# Patient Record
Sex: Female | Born: 1970 | Marital: Married | Smoking: Never smoker
Health system: Southern US, Community
[De-identification: ages and names within clinical notes are randomized; demographics above are authoritative.]

## PROBLEM LIST (undated history)

## (undated) DIAGNOSIS — F329 Major depressive disorder, single episode, unspecified: Secondary | ICD-10-CM

## (undated) DIAGNOSIS — R51 Headache: Secondary | ICD-10-CM

## (undated) DIAGNOSIS — F32A Depression, unspecified: Secondary | ICD-10-CM

## (undated) HISTORY — DX: Depression, unspecified: F32.A

## (undated) HISTORY — DX: Major depressive disorder, single episode, unspecified: F32.9

## (undated) HISTORY — DX: Headache: R51

---

## 2003-03-21 LAB — HM COLONOSCOPY: HM COLON: NORMAL

## 2003-08-14 HISTORY — PX: CHOLECYSTECTOMY: SHX55

## 2004-06-22 ENCOUNTER — Ambulatory Visit: Payer: Self-pay | Admitting: Internal Medicine

## 2004-09-13 ENCOUNTER — Ambulatory Visit: Payer: Self-pay | Admitting: Unknown Physician Specialty

## 2004-10-06 ENCOUNTER — Ambulatory Visit: Payer: Self-pay | Admitting: Surgery

## 2004-11-08 ENCOUNTER — Ambulatory Visit: Payer: Self-pay | Admitting: Unknown Physician Specialty

## 2005-05-17 ENCOUNTER — Ambulatory Visit: Payer: Self-pay | Admitting: Unknown Physician Specialty

## 2005-06-07 ENCOUNTER — Ambulatory Visit: Payer: Self-pay | Admitting: Unknown Physician Specialty

## 2005-08-30 ENCOUNTER — Ambulatory Visit: Payer: Self-pay | Admitting: Internal Medicine

## 2006-10-10 ENCOUNTER — Ambulatory Visit: Payer: Self-pay | Admitting: Unknown Physician Specialty

## 2008-04-22 ENCOUNTER — Ambulatory Visit: Payer: Self-pay | Admitting: Obstetrics & Gynecology

## 2008-08-13 HISTORY — PX: TUBAL LIGATION: SHX77

## 2009-01-27 ENCOUNTER — Ambulatory Visit: Payer: Self-pay | Admitting: Unknown Physician Specialty

## 2009-03-29 ENCOUNTER — Ambulatory Visit: Payer: Self-pay | Admitting: Internal Medicine

## 2009-09-20 ENCOUNTER — Ambulatory Visit: Payer: Self-pay | Admitting: Urology

## 2010-08-15 ENCOUNTER — Ambulatory Visit: Payer: Self-pay | Admitting: Internal Medicine

## 2010-12-21 ENCOUNTER — Ambulatory Visit: Payer: Self-pay | Admitting: Unknown Physician Specialty

## 2011-01-16 ENCOUNTER — Ambulatory Visit: Payer: Self-pay | Admitting: Internal Medicine

## 2011-07-11 ENCOUNTER — Ambulatory Visit (INDEPENDENT_AMBULATORY_CARE_PROVIDER_SITE_OTHER): Payer: PRIVATE HEALTH INSURANCE | Admitting: Internal Medicine

## 2011-07-11 ENCOUNTER — Encounter: Payer: Self-pay | Admitting: Internal Medicine

## 2011-07-11 VITALS — BP 102/66 | HR 75 | Temp 98.2°F | Resp 16 | Ht 65.0 in | Wt 219.8 lb

## 2011-07-11 DIAGNOSIS — F329 Major depressive disorder, single episode, unspecified: Secondary | ICD-10-CM | POA: Insufficient documentation

## 2011-07-11 DIAGNOSIS — L299 Pruritus, unspecified: Secondary | ICD-10-CM

## 2011-07-11 MED ORDER — HYDROXYZINE HCL 25 MG PO TABS: 25.0000 mg | ORAL_TABLET | Freq: Three times a day (TID) | ORAL | Status: AC | PRN

## 2011-07-11 MED ORDER — DULOXETINE HCL 60 MG PO CPEP: 60.0000 mg | ORAL_CAPSULE | Freq: Every day | ORAL | Status: DC

## 2011-07-11 MED ORDER — ALPRAZOLAM 0.5 MG PO TABS: 0.5000 mg | ORAL_TABLET | Freq: Every day | ORAL | Status: AC | PRN

## 2011-07-12 NOTE — Progress Notes (Signed)
  Subjective:    Patient ID: Makayla Brown, female    DOB: 23-Dec-1970, 40 y.o.   MRN: [id number]  HPI    Review of Systems     Objective:   Physical Exam        Assessment & Plan:   Subjective:     Makayla Brown is a 40 y.o. female here for a routine exam.  Current complaints: none.  Personal health questionnaire reviewed: not asked.   Gynecologic History Patient's last menstrual period was 07/10/2011. Contraception: tubal ligation Last Pap: 2011. Results were: normal Last mammogram: 2012. Results were: normal  Obstetric History OB History    Grav Para Term Preterm Abortions TAB SAB Ect Mult Living                   The following portions of the patient's history were reviewed and updated as appropriate: allergies, current medications, past family history, past medical history, past social history, past surgical history and problem list.  Review of Systems A comprehensive review of systems was negative except for: Allergic/Immunologic: positive for urticaria    Objective:    BP 102/66  Pulse 75  Temp(Src) 98.2 F (36.8 C) (Oral)  Resp 16  Ht 5\' 5"  (1.651 m)  Wt 219 lb 12 oz (99.678 kg)  BMI 36.57 kg/m2  SpO2 100%  LMP 07/10/2011 General appearance: alert, cooperative and appears stated age Eyes: conjunctivae/corneas clear. PERRL, EOM's intact. Fundi benign. Ears: normal TM's and external ear canals both ears Throat: lips, mucosa, and tongue normal; teeth and gums normal Neck: no adenopathy, no carotid bruit, no JVD, supple, symmetrical, trachea midline, thyroid not enlarged, symmetric, no tenderness/mass/nodules and papular rash noted on right lateral neck and upper chest Back: symmetric, no curvature. ROM normal. No CVA tenderness. Lungs: clear to auscultation bilaterally Heart: regular rate and rhythm, S1, S2 normal, no murmur, click, rub or gallop Abdomen: soft, non-tender; bowel sounds normal; no masses,  no organomegaly Extremities: extremities  normal, atraumatic, no cyanosis or edema Pulses: 2+ and symmetric Lymph nodes: Cervical, supraclavicular, and axillary nodes normal. Neurologic: Alert and oriented X 3, normal strength and tone. Normal symmetric reflexes. Normal coordination and gait    Assessment:    Healthy female nongynelogic exam.  Recent fasting labs were reviewed with patient .   Microscopic hematuria:  Chronic, low level with prior urology evaluation. Pruritic rash:  Reviewed possible etiologies, no clear precipitants.  No improvement with steroid cream.  Looks like contact dermatitis , Benadryl cream and  atarax,  Stop steroid cream. If still present in 4 weeks refer to Dermatology for biopsy.     Plan:    Follow up in: 1 year.

## 2011-08-01 ENCOUNTER — Encounter: Payer: Self-pay | Admitting: Internal Medicine

## 2011-12-25 ENCOUNTER — Ambulatory Visit: Payer: Self-pay | Admitting: Internal Medicine

## 2012-01-22 ENCOUNTER — Encounter: Payer: Self-pay | Admitting: Internal Medicine

## 2012-03-20 ENCOUNTER — Ambulatory Visit (INDEPENDENT_AMBULATORY_CARE_PROVIDER_SITE_OTHER): Payer: PRIVATE HEALTH INSURANCE | Admitting: Internal Medicine

## 2012-03-20 ENCOUNTER — Encounter: Payer: Self-pay | Admitting: Internal Medicine

## 2012-03-20 VITALS — BP 100/72 | HR 76 | Temp 98.5°F | Resp 14 | Wt 220.0 lb

## 2012-03-20 DIAGNOSIS — K2960 Other gastritis without bleeding: Secondary | ICD-10-CM

## 2012-03-20 DIAGNOSIS — F411 Generalized anxiety disorder: Secondary | ICD-10-CM

## 2012-03-20 DIAGNOSIS — G44229 Chronic tension-type headache, not intractable: Secondary | ICD-10-CM

## 2012-03-20 DIAGNOSIS — R1013 Epigastric pain: Secondary | ICD-10-CM

## 2012-03-20 DIAGNOSIS — F419 Anxiety disorder, unspecified: Secondary | ICD-10-CM

## 2012-03-20 DIAGNOSIS — F329 Major depressive disorder, single episode, unspecified: Secondary | ICD-10-CM

## 2012-03-20 MED ORDER — OMEPRAZOLE 40 MG PO CPDR: 40.0000 mg | DELAYED_RELEASE_CAPSULE | Freq: Every day | ORAL | Status: DC

## 2012-03-20 MED ORDER — SERTRALINE HCL 50 MG PO TABS: 50.0000 mg | ORAL_TABLET | Freq: Every day | ORAL | Status: DC

## 2012-03-20 NOTE — Progress Notes (Signed)
Patient ID: Makayla Brown, female   DOB: Jan 19, 1971, 41 y.o.   MRN: [id number]  Subjective:    Makayla Brown is a 41 y.o. female who presents for evaluation of headache. Symptoms began about 3 weeks ago. Generally, the headaches last about 2 hours and occur every day. The headaches do not seem to be related to any time of the day. The headaches are usually pounding and are located in different locations.  The patient rates her most severe headaches a 7 on a scale from 1 to 10. Recently, the headaches have been increasing in frequency. Work attendance or other daily activities are affected by the headaches. Precipitating factors include: stress. The headaches are usually not preceded by an aura. Associated neurologic symptoms: depression. The patient denies dizziness, numbness of extremities, speech difficulties, vision problems and vomiting in the early morning. Home treatment has included ibuprofen with some improvement. Other history includes: nothing pertinent. Family history includes no known family members with significant headaches.  The following portions of the patient's history were reviewed and updated as appropriate: allergies, current medications, past family history, past medical history, past social history, past surgical history and problem list.  Review of Systems A comprehensive review of systems was negative except for: Respiratory: positive for snoring Gastrointestinal: positive for dyspepsia and reflux symptoms    Objective:    BP 100/72  Pulse 76  Temp 98.5 F (36.9 C) (Oral)  Resp 14  Wt 220 lb (99.791 kg)  SpO2 95%  LMP 02/18/2012  General Appearance:    Alert, cooperative, no distress, appears stated age  Head:    Normocephalic, without obvious abnormality, atraumatic  Eyes:    PERRL, conjunctiva/corneas clear, EOM's intact, fundi    benign, both eyes  Ears:    Normal TM's and external ear canals, both ears  Nose:   Nares normal, septum midline, mucosa normal, no  drainage    or sinus tenderness  Throat:   Lips, mucosa, and tongue normal; teeth and gums normal  Neck:   Supple, symmetrical, trachea midline, no adenopathy;    thyroid:  no enlargement/tenderness/nodules; no carotid   bruit or JVD  Back:     Symmetric, no curvature, ROM normal, no CVA tenderness  Lungs:     Clear to auscultation bilaterally, respirations unlabored  Chest Wall:    No tenderness or deformity   Heart:    Regular rate and rhythm, S1 and S2 normal, no murmur, rub   or gallop  Extremities:   Extremities normal, atraumatic, no cyanosis or edema  Pulses:   2+ and symmetric all extremities  Skin:   Skin color, texture, turgor normal, no rashes or lesions  Lymph nodes:   Cervical, supraclavicular, and axillary nodes normal  Neurologic:   CNII-XII intact, normal strength, sensation and reflexes    throughout      Assessment:    Depression With generalized anxiety features.  Discussed adding low dose sertraline to cymbalta, starting with 25 mg daily for the first week and increase to 50 mg if tolerated. Addition of low-dose alprazolam one to 2 times daily for insomnia or anxiety when necessary. Reassessment either in person or via my chart in 30 days.  Reflux gastritis Trial of over-the-counter omeprazole 20 mg daily take 20 minutes prior to breakfast. If symptoms do not improve she will call back. Symptoms do improve I suggested taking it for 4-6 weeks and then stopping.  Headache, tension type, chronic Suggested by exam and history. Patient was given advice to  lie in darkened room and apply cold packs as needed for pain. Side effect profile discussed in detail. Asked to keep headache diary. Patient reassured that neurodiagnostic workup not indicated from benign H&P.     Updated Medication List Outpatient Encounter Prescriptions as of 03/20/2012  Medication Sig Dispense Refill  . ALPRAZolam (XANAX) 0.5 MG tablet Take 1 tablet (0.5 mg total) by mouth daily as needed for  sleep or anxiety.  30 tablet  0  . DULoxetine (CYMBALTA) 60 MG capsule Take 1 capsule (60 mg total) by mouth daily.  30 capsule  6  . omeprazole (PRILOSEC) 40 MG capsule Take 1 capsule (40 mg total) by mouth daily.  30 capsule  3  . sertraline (ZOLOFT) 50 MG tablet Take 1 tablet (50 mg total) by mouth daily.  30 tablet  3

## 2012-03-20 NOTE — Patient Instructions (Addendum)
I am adding omeprazole for your recurrent nausea and gastritis symptoms.  Take it daily about 20 minutes before a meal.  You can stop it in a month and see if the symptoms return.  Addin zoloft for the daily anxiety.  Start with 1/2 tablet daily for one week, then increase to whole tablet.  Carve out 20 to 30 minutes to time for yourself to relax an do something you enjoy  Ask your husband about your snoring and your breathing pattern.  If it suggests sleep apnea,  Let me know.  Let me know how you are doing on MyChart

## 2012-03-22 ENCOUNTER — Encounter: Payer: Self-pay | Admitting: Internal Medicine

## 2012-03-22 DIAGNOSIS — G44229 Chronic tension-type headache, not intractable: Secondary | ICD-10-CM | POA: Insufficient documentation

## 2012-03-22 DIAGNOSIS — K2960 Other gastritis without bleeding: Secondary | ICD-10-CM | POA: Insufficient documentation

## 2012-03-22 NOTE — Assessment & Plan Note (Signed)
Trial of over-the-counter omeprazole 20 mg daily take 20 minutes prior to breakfast. If symptoms do not improve she will call back. Symptoms do improve I suggested taking it for 4-6 weeks and then stopping.

## 2012-03-22 NOTE — Assessment & Plan Note (Signed)
Suggested by

## 2012-03-22 NOTE — Assessment & Plan Note (Addendum)
With generalized anxiety features.  Discussed adding low dose sertraline to cymbalta, starting with 25 mg daily for the first week and increase to 50 mg if tolerated. Addition of low-dose alprazolam one to 2 times daily for insomnia or anxiety when necessary. Reassessment either in person or via my chart in 30 days.

## 2012-06-14 ENCOUNTER — Ambulatory Visit: Payer: Self-pay | Admitting: Emergency Medicine

## 2012-08-28 ENCOUNTER — Other Ambulatory Visit: Payer: Self-pay | Admitting: Internal Medicine

## 2012-08-28 NOTE — Telephone Encounter (Signed)
Med filled.  

## 2013-02-18 ENCOUNTER — Encounter: Payer: Self-pay | Admitting: Internal Medicine

## 2013-03-06 ENCOUNTER — Encounter: Payer: PRIVATE HEALTH INSURANCE | Admitting: Internal Medicine

## 2013-03-18 ENCOUNTER — Encounter: Payer: PRIVATE HEALTH INSURANCE | Admitting: Internal Medicine

## 2013-03-20 ENCOUNTER — Other Ambulatory Visit (HOSPITAL_COMMUNITY)
Admission: RE | Admit: 2013-03-20 | Discharge: 2013-03-20 | Disposition: A | Discharge status: Home / Self Care | Payer: BC Managed Care – PPO | Source: Ambulatory Visit | Attending: Internal Medicine | Admitting: Internal Medicine

## 2013-03-20 ENCOUNTER — Ambulatory Visit (INDEPENDENT_AMBULATORY_CARE_PROVIDER_SITE_OTHER): Payer: BC Managed Care – PPO | Admitting: Internal Medicine

## 2013-03-20 ENCOUNTER — Encounter: Payer: Self-pay | Admitting: Internal Medicine

## 2013-03-20 VITALS — BP 114/72 | HR 58 | Temp 98.4°F | Resp 14 | Ht 65.0 in | Wt 207.2 lb

## 2013-03-20 DIAGNOSIS — Z1151 Encounter for screening for human papillomavirus (HPV): Secondary | ICD-10-CM | POA: Insufficient documentation

## 2013-03-20 DIAGNOSIS — Z01419 Encounter for gynecological examination (general) (routine) without abnormal findings: Secondary | ICD-10-CM | POA: Insufficient documentation

## 2013-03-20 DIAGNOSIS — Z1239 Encounter for other screening for malignant neoplasm of breast: Secondary | ICD-10-CM

## 2013-03-20 DIAGNOSIS — Z0000 Encounter for general adult medical examination without abnormal findings: Secondary | ICD-10-CM

## 2013-03-20 DIAGNOSIS — F329 Major depressive disorder, single episode, unspecified: Secondary | ICD-10-CM

## 2013-03-20 DIAGNOSIS — E669 Obesity, unspecified: Secondary | ICD-10-CM

## 2013-03-20 NOTE — Patient Instructions (Addendum)
You have lost 13 lbs since last visit.  Good job!  Your BMI is now  34.4 (down from 36.5) .  Your goal is to get your BMI < 30 which should take another 20 lbs   You are not due to another PAP for 3 years unless this one is abnormal

## 2013-03-22 ENCOUNTER — Encounter: Payer: Self-pay | Admitting: Internal Medicine

## 2013-03-22 DIAGNOSIS — Z0000 Encounter for general adult medical examination without abnormal findings: Secondary | ICD-10-CM | POA: Insufficient documentation

## 2013-03-22 DIAGNOSIS — E669 Obesity, unspecified: Secondary | ICD-10-CM | POA: Insufficient documentation

## 2013-03-22 NOTE — Assessment & Plan Note (Signed)
Improving  BMI with wt loss of 13 lbs since last visit.   recommended a low glycemic index diet utilizing smaller more frequent meals to increase metabolism.  I have also recommended that patient add exercising with a goal of 30 minutes of aerobic exercise a minimum of 5 days per week. Screening for lipid disorders, thyroid and diabetes to be done today.

## 2013-03-22 NOTE — Progress Notes (Signed)
Patient ID: Makayla Brown, female   DOB: [birth year], 42 y.o.   MRN: [id number]  Subjective:     Makayla Brown is a 42 y.o. female and is here for a comprehensive physical exam. The patient reports no problems.  History   Social History  . Marital Status: Married    Spouse Name: N/A    Number of Children: N/A  . Years of Education: N/A   Occupational History  . Not on file.   Social History Main Topics  . Smoking status: Never Smoker   . Smokeless tobacco: Never Used  . Alcohol Use: No  . Drug Use: No  . Sexually Active: Not on file   Other Topics Concern  . Not on file   Social History Narrative  . No narrative on file   Health Maintenance  Topic Date Due  . Pap Smear  10/18/2012  . Influenza Vaccine  04/13/2013  . Tetanus/tdap  05/20/2022    The following portions of the patient's history were reviewed and updated as appropriate: allergies, current medications, past family history, past medical history, past social history, past surgical history and problem list.  Review of Systems A comprehensive review of systems was negative.   Objective:     BP 114/72  Pulse 58  Temp(Src) 98.4 F (36.9 C) (Oral)  Resp 14  Ht 5\' 5"  (1.651 m)  Wt 207 lb 4 oz (94.008 kg)  BMI 34.49 kg/m2  SpO2 94%  LMP 02/24/2013  General Appearance:    Alert, cooperative, no distress, appears stated age  Head:    Normocephalic, without obvious abnormality, atraumatic  Eyes:    PERRL, conjunctiva/corneas clear, EOM's intact, fundi    benign, both eyes  Ears:    Normal TM's and external ear canals, both ears  Nose:   Nares normal, septum midline, mucosa normal, no drainage    or sinus tenderness  Throat:   Lips, mucosa, and tongue normal; teeth and gums normal  Neck:   Supple, symmetrical, trachea midline, no adenopathy;    thyroid:  no enlargement/tenderness/nodules; no carotid   bruit or JVD  Back:     Symmetric, no curvature, ROM normal, no CVA tenderness  Lungs:     Clear to  auscultation bilaterally, respirations unlabored  Chest Wall:    No tenderness or deformity   Heart:    Regular rate and rhythm, S1 and S2 normal, no murmur, rub   or gallop  Breast Exam:    No tenderness, masses, or nipple abnormality  Abdomen:     Soft, non-tender, bowel sounds active all four quadrants,    no masses, no organomegaly  Genitalia:    Pelvic: cervix normal in appearance, external genitalia normal, no adnexal masses or tenderness, no cervical motion tenderness, rectovaginal septum normal, uterus normal size, shape, and consistency and vagina normal without discharge  Extremities:   Extremities normal, atraumatic, no cyanosis or edema  Pulses:   2+ and symmetric all extremities  Skin:   Skin color, texture, turgor normal, no rashes or lesions  Lymph nodes:   Cervical, supraclavicular, and axillary nodes normal  Neurologic:   CNII-XII intact, normal strength, sensation and reflexes    throughout   Assessment and Plan:  Routine general medical examination at a health care facility Annual comprehensive exam was done including breast, pelvic and PAP smear. All screenings have been addressed .   Obesity, unspecified Improving  BMI with wt loss of 13 lbs since last visit.   recommended a low  glycemic index diet utilizing smaller more frequent meals to increase metabolism.  I have also recommended that patient add exercising with a goal of 30 minutes of aerobic exercise a minimum of 5 days per week. Screening for lipid disorders, thyroid and diabetes to be done today.    Depression She did not tolerate  addition of sertraline. Continue cymbalta  With prn alprazolam for severe anxiety.    Updated Medication List Outpatient Encounter Prescriptions as of 03/20/2013  Medication Sig Dispense Refill  . CYMBALTA 60 MG capsule TAKE ONE CAPSULE BY MOUTH EVERY DAY  30 capsule  5  . omeprazole (PRILOSEC) 40 MG capsule Take 1 capsule (40 mg total) by mouth daily.  30 capsule  3  .  sertraline (ZOLOFT) 50 MG tablet Take 1 tablet (50 mg total) by mouth daily.  30 tablet  3   No facility-administered encounter medications on file as of 03/20/2013.

## 2013-03-22 NOTE — Assessment & Plan Note (Addendum)
She did not tolerate  addition of sertraline. Continue cymbalta  With prn alprazolam for severe anxiety.

## 2013-03-22 NOTE — Assessment & Plan Note (Signed)
Annual comprehensive exam was done including breast, pelvic and PAP smear. All screenings have been addressed .  

## 2013-03-24 ENCOUNTER — Encounter: Payer: Self-pay | Admitting: *Deleted

## 2013-03-24 MED ORDER — FLUCONAZOLE 150 MG PO TABS: 150.0000 mg | ORAL_TABLET | Freq: Every day | ORAL | Status: DC

## 2013-03-24 NOTE — Addendum Note (Signed)
Addended by: Sherlene Shams on: 03/24/2013 06:58 AM   Modules accepted: Orders

## 2013-04-07 ENCOUNTER — Encounter: Payer: Self-pay | Admitting: Internal Medicine

## 2013-04-21 ENCOUNTER — Encounter: Payer: Self-pay | Admitting: Internal Medicine

## 2013-06-18 ENCOUNTER — Other Ambulatory Visit: Payer: Self-pay

## 2013-06-22 ENCOUNTER — Encounter: Payer: Self-pay | Admitting: Adult Health

## 2013-06-22 ENCOUNTER — Ambulatory Visit (INDEPENDENT_AMBULATORY_CARE_PROVIDER_SITE_OTHER): Payer: BC Managed Care – PPO | Admitting: Adult Health

## 2013-06-22 VITALS — BP 128/74 | HR 72 | Temp 98.1°F | Resp 16 | Wt 206.0 lb

## 2013-06-22 DIAGNOSIS — J329 Chronic sinusitis, unspecified: Secondary | ICD-10-CM

## 2013-06-22 MED ORDER — FLUCONAZOLE 150 MG PO TABS: 150.0000 mg | ORAL_TABLET | Freq: Every day | ORAL | Status: DC

## 2013-06-22 MED ORDER — AMOXICILLIN-POT CLAVULANATE 875-125 MG PO TABS: 1.0000 | ORAL_TABLET | Freq: Two times a day (BID) | ORAL | Status: DC

## 2013-06-22 NOTE — Progress Notes (Signed)
Pre visit review using our clinic review tool, if applicable. No additional management support is needed unless otherwise documented below in the visit note. 

## 2013-06-22 NOTE — Progress Notes (Signed)
  Subjective:    Patient ID: Makayla Brown, female    DOB: September 06, 1970, 42 y.o.   MRN: [id number]  HPI  Pt is a 42 year old female who presents to clinic with sinus congestion x4-5 days, green drainage. She denies fever or chills. She has been taking Sudafed. Some post nasal drip causing throat irritation. Mild cough.  Current Outpatient Prescriptions on File Prior to Visit  Medication Sig Dispense Refill  . CYMBALTA 60 MG capsule TAKE ONE CAPSULE BY MOUTH EVERY DAY  30 capsule  5  . omeprazole (PRILOSEC) 40 MG capsule Take 1 capsule (40 mg total) by mouth daily.  30 capsule  3  . sertraline (ZOLOFT) 50 MG tablet Take 1 tablet (50 mg total) by mouth daily.  30 tablet  3   No current facility-administered medications on file prior to visit.      Review of Systems  Constitutional: Negative for fever and chills.  HENT: Positive for congestion, postnasal drip, rhinorrhea, sinus pressure and sore throat.   Respiratory: Positive for cough.        Objective:   Physical Exam  Constitutional: She is oriented to person, place, and time. She appears well-developed and well-nourished. No distress.  HENT:  Head: Normocephalic and atraumatic.  Right Ear: External ear normal.  Left Ear: External ear normal.  Mild pharyngeal erythema. No exudate.  Neck: Normal range of motion. Neck supple. No tracheal deviation present.  Cardiovascular: Normal rate, regular rhythm and normal heart sounds.  Exam reveals no gallop and no friction rub.   No murmur heard. Pulmonary/Chest: Effort normal and breath sounds normal. No respiratory distress. She has no wheezes. She has no rales.  Lymphadenopathy:    She has no cervical adenopathy.  Neurological: She is alert and oriented to person, place, and time.  Skin: Skin is warm and dry.  Psychiatric: She has a normal mood and affect. Her behavior is normal. Judgment and thought content normal.          Assessment & Plan:

## 2013-06-22 NOTE — Patient Instructions (Signed)
Start Augmentin twice daily x 10 days.  Irrigate sinuses with saline.    Sinusitis is a condition that can cause a stuffy nose, pain in the face, and yellow or green discharge (mucus) from the nose. The sinuses are hollow areas in the bones of the face. They have a thin lining that normally makes a small amount of mucus. When this lining gets infected, it swells and makes extra mucus. This causes symptoms.   Sinusitis can occur when a person gets sick with a cold. The germs causing the cold can also infect the sinuses. Many times, a person feels like his or her cold is getting better. But then he or she gets sinusitis and begins to feel sick again.  What are the symptoms of sinusitis? - Common symptoms of sinusitis include:  Stuffy or blocked nose  Thick yellow or green discharge from the nose  Pain in the teeth  Pain or pressure in the face - This often feels worse when a person bends forward.   People with sinusitis can also have other symptoms that include:  Fever  Cough  Trouble smelling  Ear pressure or fullness  Headache  Bad breath  Feeling tired   Most of the time, symptoms start to improve in 7 to 10 days.  See your doctor or nurse if your symptoms last more than 7 days, or if your symptoms get better at first but then get worse.  Sometimes, sinusitis can lead to serious problems. See your doctor or nurse right away (do not wait 7 days) if you have:  Fever higher than 102.68F (39.2C)  Sudden and severe pain in the face and head  Trouble seeing or seeing double  Trouble thinking clearly  Swelling or redness around 1 or both eyes  Trouble breathing or a stiff neck   Is there anything I can do on my own to feel better? - Yes. To reduce your symptoms, you can: Take an over-the-counter pain reliever to reduce the pain  Rinse your nose and sinuses with salt water a few times a day - Ask your doctor or nurse about the best way to do this.  Use a decongestant nose spray  - These sprays are sold in a pharmacy. But do not use decongestant nose sprays for more than 2 to 3 days in a row. Using them more than 3 days in a row can make symptoms worse.   You should NOT take an antihistamine for sinusitis. Common antihistamines include diphenhydramine (sample brand name: Benadryl), chlorpheniramine (sample brand name: Chlor-Trimeton), loratadine (sample brand name: Claritin), and cetirizine (sample brand name: Zyrtec). They can treat allergies, but not sinus infections, and could increase your discomfort by drying the lining of your nose and sinuses, or making you tired.   Your doctor might also prescribe a steroid nose spray to reduce the swelling in your nose. (Steroid nose sprays do not contain the same steroids that athletes take to build muscle.)  How is sinusitis treated? - Most of the time, sinusitis does not need to be treated with antibiotic medicines. This is because most sinusitis is caused by viruses - not bacteria - and antibiotics do not kill viruses. Many people get over sinus infections without antibiotics.  Some people with sinusitis do need treatment with antibiotics. If your symptoms have not improved after 7 to 10 days, ask your doctor if you should take antibiotics. Your doctor might recommend that you wait 1 more week to see if your symptoms improve.  But if you have symptoms such as a fever or a lot of pain, he or she might prescribe antibiotics. It is important to follow your doctor's instructions about taking your antibiotics.

## 2013-06-22 NOTE — Assessment & Plan Note (Signed)
Sinusitis - initially with clear drainage now green. Start augmentin x 10 days. Saline spray. Supportive tx for symptoms. RTC if symptoms not improved within 3-4 days.

## 2013-10-13 ENCOUNTER — Other Ambulatory Visit: Payer: Self-pay | Admitting: *Deleted

## 2013-10-13 MED ORDER — DULOXETINE HCL 60 MG PO CPEP: ORAL_CAPSULE | ORAL | Status: DC

## 2013-11-05 ENCOUNTER — Ambulatory Visit: Payer: BC Managed Care – PPO | Admitting: Internal Medicine

## 2013-11-05 ENCOUNTER — Ambulatory Visit (INDEPENDENT_AMBULATORY_CARE_PROVIDER_SITE_OTHER): Payer: BC Managed Care – PPO | Admitting: Internal Medicine

## 2013-11-05 ENCOUNTER — Encounter: Payer: Self-pay | Admitting: Internal Medicine

## 2013-11-05 VITALS — BP 106/70 | HR 68 | Temp 97.9°F | Resp 16 | Wt 207.2 lb

## 2013-11-05 DIAGNOSIS — Z0000 Encounter for general adult medical examination without abnormal findings: Secondary | ICD-10-CM

## 2013-11-05 DIAGNOSIS — J069 Acute upper respiratory infection, unspecified: Secondary | ICD-10-CM

## 2013-11-05 LAB — COMPREHENSIVE METABOLIC PANEL
ALK PHOS: 71 U/L (ref 39–117)
ALT: 14 U/L (ref 0–35)
AST: 14 U/L (ref 0–37)
Albumin: 3.8 g/dL (ref 3.5–5.2)
BILIRUBIN TOTAL: 0.4 mg/dL (ref 0.2–1.2)
BUN: 12 mg/dL (ref 6–23)
CO2: 29 mEq/L (ref 19–32)
Calcium: 8.7 mg/dL (ref 8.4–10.5)
Chloride: 108 mEq/L (ref 96–112)
Creat: 0.74 mg/dL (ref 0.50–1.10)
GLUCOSE: 74 mg/dL (ref 70–99)
Potassium: 4.6 mEq/L (ref 3.5–5.3)
SODIUM: 144 meq/L (ref 135–145)
Total Protein: 6 g/dL (ref 6.0–8.3)

## 2013-11-05 LAB — CBC WITH DIFFERENTIAL/PLATELET
BASOS PCT: 0 % (ref 0–1)
Basophils Absolute: 0 10*3/uL (ref 0.0–0.1)
EOS ABS: 0.1 10*3/uL (ref 0.0–0.7)
Eosinophils Relative: 2 % (ref 0–5)
HCT: 35.7 % — ABNORMAL LOW (ref 36.0–46.0)
HEMOGLOBIN: 11.7 g/dL — AB (ref 12.0–15.0)
Lymphocytes Relative: 29 % (ref 12–46)
Lymphs Abs: 1.9 10*3/uL (ref 0.7–4.0)
MCH: 28.3 pg (ref 26.0–34.0)
MCHC: 32.8 g/dL (ref 30.0–36.0)
MCV: 86.2 fL (ref 78.0–100.0)
MONOS PCT: 6 % (ref 3–12)
Monocytes Absolute: 0.4 10*3/uL (ref 0.1–1.0)
NEUTROS ABS: 4.2 10*3/uL (ref 1.7–7.7)
NEUTROS PCT: 63 % (ref 43–77)
PLATELETS: 275 10*3/uL (ref 150–400)
RBC: 4.14 MIL/uL (ref 3.87–5.11)
RDW: 14.6 % (ref 11.5–15.5)
WBC: 6.6 10*3/uL (ref 4.0–10.5)

## 2013-11-05 LAB — LIPID PANEL
CHOLESTEROL: 146 mg/dL (ref 0–200)
HDL: 47 mg/dL (ref >39)
LDL CALC: 78 mg/dL (ref 0–99)
TRIGLYCERIDES: 103 mg/dL (ref <150)
Total CHOL/HDL Ratio: 3.1 Ratio
VLDL: 21 mg/dL (ref 0–40)

## 2013-11-05 MED ORDER — DULOXETINE HCL 60 MG PO CPEP: ORAL_CAPSULE | ORAL | Status: DC

## 2013-11-05 MED ORDER — FLUCONAZOLE 150 MG PO TABS: 150.0000 mg | ORAL_TABLET | Freq: Every day | ORAL | Status: DC

## 2013-11-05 MED ORDER — AMOXICILLIN-POT CLAVULANATE 875-125 MG PO TABS: 1.0000 | ORAL_TABLET | Freq: Two times a day (BID) | ORAL | Status: DC

## 2013-11-05 NOTE — Progress Notes (Signed)
Pre-visit discussion using our clinic review tool. No additional management support is needed unless otherwise documented below in the visit note.  

## 2013-11-05 NOTE — Progress Notes (Signed)
Patient ID: Makayla Brown, female   DOB: Oct 18, 1970, 43 y.o.   MRN: [id number]  Patient Active Problem List   Diagnosis Date Noted  . Viral URI 11/07/2013  . Sinusitis 06/22/2013  . Routine general medical examination at a health care facility 03/22/2013  . Obesity, unspecified 03/22/2013  . Reflux gastritis 03/22/2012  . Headache, tension type, chronic 03/22/2012  . Depression     Subjective:  CC:   Chief Complaint  Patient presents with  . Follow-up    medication refills, s  . Sore Throat    with ear ache , afebrile    HPI:   Makayla Brown is a 43 y.o. female who presents for evaluation of Sore throat and right ear ache x 2 days .  No fevers or known sick contacts.  No sympotms of flu or seasonal rhinitis.     Past Medical History  Diagnosis Date  . Depression   . PXTGGYIR(485.4)     Past Surgical History  Procedure Laterality Date  . Cholecystectomy  2005    Dr. Pat Patrick, laparoscopic  . Tubal ligation  2010    Barnett Applebaum       The following portions of the patient's history were reviewed and updated as appropriate: Allergies, current medications, and problem list.    Review of Systems:   Patient denies headache, fevers, malaise, unintentional weight loss, skin rash, eye pain, sinus congestion and sinus pain, sore throat, dysphagia,  hemoptysis , cough, dyspnea, wheezing, chest pain, palpitations, orthopnea, edema, abdominal pain, nausea, melena, diarrhea, constipation, flank pain, dysuria, hematuria, urinary  Frequency, nocturia, numbness, tingling, seizures,  Focal weakness, Loss of consciousness,  Tremor, insomnia, depression, anxiety, and suicidal ideation.     History   Social History  . Marital Status: Married    Spouse Name: N/A    Number of Children: N/A  . Years of Education: N/A   Occupational History  . Not on file.   Social History Main Topics  . Smoking status: Never Smoker   . Smokeless tobacco: Never Used  . Alcohol Use: No  . Drug  Use: No  . Sexual Activity: Not on file   Other Topics Concern  . Not on file   Social History Narrative  . No narrative on file    Objective:  Filed Vitals:   11/05/13 0928  BP: 106/70  Pulse: 68  Temp: 97.9 F (36.6 C)  Resp: 16     General appearance: alert, cooperative and appears stated age Ears: normal TM's and external ear canals both ears Throat: lips, mucosa, and tongue normal; teeth and gums normal Neck: no adenopathy, no carotid bruit, supple, symmetrical, trachea midline and thyroid not enlarged, symmetric, no tenderness/mass/nodules Back: symmetric, no curvature. ROM normal. No CVA tenderness. Lungs: clear to auscultation bilaterally Heart: regular rate and rhythm, S1, S2 normal, no murmur, click, rub or gallop Abdomen: soft, non-tender; bowel sounds normal; no masses,  no organomegaly Pulses: 2+ and symmetric Skin: Skin color, texture, turgor normal. No rashes or lesions Lymph nodes: Cervical, supraclavicular, and axillary nodes normal.  Assessment and Plan:  Viral URI Symptoms of  URI are caused by viral infection currently given her current symptoms.   I have explained that in viral URIS, an antibiotic will not help the symptoms and will increase the risk of developing diarrhea. Advised to use oral and nasal decongestants,  Ibuprofen 400 mg and tylenol 650 mq 8 hrs for aches and pains,  tessalon every 8 hours prn cough  Advised  to start round of abx only if symptoms worsen to include fevers, facial pain, purulent sputum./drainage.    Updated Medication List Outpatient Encounter Prescriptions as of 11/05/2013  Medication Sig  . DULoxetine (CYMBALTA) 60 MG capsule TAKE ONE CAPSULE BY MOUTH EVERY DAY  . [DISCONTINUED] DULoxetine (CYMBALTA) 60 MG capsule TAKE ONE CAPSULE BY MOUTH EVERY DAY  . amoxicillin-clavulanate (AUGMENTIN) 875-125 MG per tablet Take 1 tablet by mouth 2 (two) times daily.  . fluconazole (DIFLUCAN) 150 MG tablet Take 1 tablet (150 mg total)  by mouth daily.  . fluconazole (DIFLUCAN) 150 MG tablet Take 1 tablet (150 mg total) by mouth daily.  . [DISCONTINUED] amoxicillin-clavulanate (AUGMENTIN) 875-125 MG per tablet Take 1 tablet by mouth 2 (two) times daily.  . [DISCONTINUED] omeprazole (PRILOSEC) 40 MG capsule Take 1 capsule (40 mg total) by mouth daily.  . [DISCONTINUED] sertraline (ZOLOFT) 50 MG tablet Take 1 tablet (50 mg total) by mouth daily.     Orders Placed This Encounter  Procedures  . Vit D  25 hydroxy (rtn osteoporosis monitoring)  . Lipid panel  . TSH  . CBC w/Diff  . Comp Met (CMET)    No Follow-up on file.

## 2013-11-05 NOTE — Patient Instructions (Signed)
You have a viral  Syndrome .  The post nasal drip is causing your sore throat.  Lavage your sinuses twice daly with Simply saline nasal spray.  Use benadryl 25 mg every 8 hours and Sudafed PE 10 to 30 every 8 hours to manage the drainage and congestion.  Gargle with salt water often for the sore throat.  If the throat is no better  In 3 to 4 days OR  if you develop T > 100.4,  Green nasal discharge,  Or facial pain,  Start the augmentin   Please take a probiotic ( Align, Floraque or Culturelle) for 2 weeks if you start the antibiotic to prevent a serious antibiotic associated diarrhea  Called" clostridium dificile colitis" ( should also help prevent   vaginal yeast infection) .

## 2013-11-06 LAB — VITAMIN D 25 HYDROXY (VIT D DEFICIENCY, FRACTURES): Vit D, 25-Hydroxy: 36 ng/mL (ref 30–89)

## 2013-11-06 LAB — TSH: TSH: 1.857 u[IU]/mL (ref 0.350–4.500)

## 2013-11-07 ENCOUNTER — Encounter: Payer: Self-pay | Admitting: Internal Medicine

## 2013-11-07 DIAGNOSIS — J069 Acute upper respiratory infection, unspecified: Secondary | ICD-10-CM | POA: Insufficient documentation

## 2013-11-07 NOTE — Assessment & Plan Note (Signed)
Symptoms of  URI are caused by viral infection currently given her current symptoms.   I have explained that in viral URIS, an antibiotic will not help the symptoms and will increase the risk of developing diarrhea. Advised to use oral and nasal decongestants,  Ibuprofen 400 mg and tylenol 650 mq 8 hrs for aches and pains,  tessalon every 8 hours prn cough  Advised to start round of abx only if symptoms worsen to include fevers, facial pain, purulent sputum./drainage.  

## 2013-11-08 ENCOUNTER — Encounter: Payer: Self-pay | Admitting: Internal Medicine

## 2014-06-22 LAB — HM MAMMOGRAPHY: HM Mammogram: NEGATIVE

## 2014-12-14 ENCOUNTER — Other Ambulatory Visit: Payer: Self-pay | Admitting: Internal Medicine

## 2015-03-21 ENCOUNTER — Ambulatory Visit (INDEPENDENT_AMBULATORY_CARE_PROVIDER_SITE_OTHER): Payer: Self-pay | Admitting: Internal Medicine

## 2015-03-21 ENCOUNTER — Ambulatory Visit (INDEPENDENT_AMBULATORY_CARE_PROVIDER_SITE_OTHER)
Admission: RE | Admit: 2015-03-21 | Discharge: 2015-03-21 | Disposition: A | Discharge status: Home / Self Care | Payer: BC Managed Care – PPO | Source: Ambulatory Visit | Attending: Internal Medicine | Admitting: Internal Medicine

## 2015-03-21 VITALS — BP 124/82 | HR 62 | Temp 98.1°F | Resp 16 | Ht 65.0 in | Wt 221.0 lb

## 2015-03-21 DIAGNOSIS — R05 Cough: Secondary | ICD-10-CM

## 2015-03-21 DIAGNOSIS — R059 Cough, unspecified: Secondary | ICD-10-CM

## 2015-03-21 DIAGNOSIS — F329 Major depressive disorder, single episode, unspecified: Secondary | ICD-10-CM | POA: Diagnosis not present

## 2015-03-21 DIAGNOSIS — J069 Acute upper respiratory infection, unspecified: Secondary | ICD-10-CM

## 2015-03-21 DIAGNOSIS — Z1239 Encounter for other screening for malignant neoplasm of breast: Secondary | ICD-10-CM

## 2015-03-21 DIAGNOSIS — Z1159 Encounter for screening for other viral diseases: Secondary | ICD-10-CM | POA: Diagnosis not present

## 2015-03-21 DIAGNOSIS — E669 Obesity, unspecified: Secondary | ICD-10-CM

## 2015-03-21 DIAGNOSIS — Z0000 Encounter for general adult medical examination without abnormal findings: Secondary | ICD-10-CM | POA: Diagnosis not present

## 2015-03-21 DIAGNOSIS — K2960 Other gastritis without bleeding: Secondary | ICD-10-CM

## 2015-03-21 DIAGNOSIS — F32A Depression, unspecified: Secondary | ICD-10-CM

## 2015-03-21 LAB — COMPREHENSIVE METABOLIC PANEL
ALBUMIN: 3.9 g/dL (ref 3.5–5.2)
ALT: 19 U/L (ref 0–35)
AST: 15 U/L (ref 0–37)
Alkaline Phosphatase: 86 U/L (ref 39–117)
BUN: 13 mg/dL (ref 6–23)
CHLORIDE: 107 meq/L (ref 96–112)
CO2: 29 meq/L (ref 19–32)
Calcium: 9.2 mg/dL (ref 8.4–10.5)
Creatinine, Ser: 0.75 mg/dL (ref 0.40–1.20)
GFR: 88.51 mL/min (ref >60.00)
Glucose, Bld: 98 mg/dL (ref 70–99)
Potassium: 4.8 mEq/L (ref 3.5–5.1)
Sodium: 142 mEq/L (ref 135–145)
Total Bilirubin: 0.5 mg/dL (ref 0.2–1.2)
Total Protein: 6.3 g/dL (ref 6.0–8.3)

## 2015-03-21 LAB — CBC WITH DIFFERENTIAL/PLATELET
Basophils Absolute: 0 10*3/uL (ref 0.0–0.1)
Basophils Relative: 0.4 % (ref 0.0–3.0)
EOS ABS: 0.1 10*3/uL (ref 0.0–0.7)
Eosinophils Relative: 2 % (ref 0.0–5.0)
HEMATOCRIT: 34.4 % — AB (ref 36.0–46.0)
HEMOGLOBIN: 11.4 g/dL — AB (ref 12.0–15.0)
LYMPHS ABS: 1.7 10*3/uL (ref 0.7–4.0)
LYMPHS PCT: 27 % (ref 12.0–46.0)
MCHC: 33 g/dL (ref 30.0–36.0)
MCV: 87.8 fl (ref 78.0–100.0)
MONO ABS: 0.4 10*3/uL (ref 0.1–1.0)
Monocytes Relative: 6.4 % (ref 3.0–12.0)
Neutro Abs: 4.1 10*3/uL (ref 1.4–7.7)
Neutrophils Relative %: 64.2 % (ref 43.0–77.0)
PLATELETS: 263 10*3/uL (ref 150.0–400.0)
RBC: 3.92 Mil/uL (ref 3.87–5.11)
RDW: 14.2 % (ref 11.5–15.5)
WBC: 6.3 10*3/uL (ref 4.0–10.5)

## 2015-03-21 LAB — LIPID PANEL
Cholesterol: 158 mg/dL (ref 0–200)
HDL: 48.6 mg/dL (ref >39.00)
LDL Cholesterol: 84 mg/dL (ref 0–99)
NonHDL: 109.78
TRIGLYCERIDES: 129 mg/dL (ref 0.0–149.0)
Total CHOL/HDL Ratio: 3
VLDL: 25.8 mg/dL (ref 0.0–40.0)

## 2015-03-21 LAB — TSH: TSH: 1.34 u[IU]/mL (ref 0.35–4.50)

## 2015-03-21 MED ORDER — PREDNISONE 10 MG PO TABS: ORAL_TABLET | ORAL | Status: DC

## 2015-03-21 MED ORDER — AMOXICILLIN-POT CLAVULANATE 875-125 MG PO TABS: 1.0000 | ORAL_TABLET | Freq: Two times a day (BID) | ORAL | Status: DC

## 2015-03-21 NOTE — Progress Notes (Signed)
Pre visit review using our clinic review tool, if applicable. No additional management support is needed unless otherwise documented below in the visit note. 

## 2015-03-21 NOTE — Patient Instructions (Signed)
You have a viral  Syndrome .  The post nasal drip is causing your sore throat and the sinus congestion is causing your ear fullness.  Start the prrednisone taper  Lavage your sinuses twice daily with Simply saline nasal spray.  Use benadryl 25 mg every 8 hours for the post nasal drip and Afrin nasal spray every 12 hours  as needed for the congestion.  Gargle with salt water as needed for the sore throat.  Delsym is available OTC as a cough syrup   Start the antibiotic in 48 hours if not better .  Please take a probiotic ( Align, Floraque or Culturelle) or the generic version of one of these  For a minimum of 3 weeks to prevent a serious antibiotic associated diarrhea  Called clostridium dificile colitis   The  diet I discussed with you today is the 10 day Green Smoothie Cleansing /Detox Diet by Linden Dolin . available on [place] for around $10.  This is not a low carb or a weight loss diet,  It is fundamentally a "cleansing" low fat diet that eliminates sugar, gluten, caffeine, alcohol and dairy for 10 days .  What you add back after the initial ten days is entirely up to  you!  You can expect to lose 5 to 10 lbs depending on how strict you are.   I suggest drinking 2 smoothies daily and keeping one chewable meal (but keep it simple, like baked fish and salad, rice or bok choy) .  You snack primarily on fresh  fruit, egg whites and judicious quantities of nuts. You can add vegetable based protein powder (nothing with whey , since whey is dairy) in it.  [employer] has a [employer], [employer] . It does require some form of a nutrient extractor (Vita Mix, a electric juicer,  Or a Nutribullet Rx).  i have found that using frozen fruits is much more convenient and cost effective. You can even find plenty of organic fruit in the frozen fruit section of [employer].  Just thaw what you need for the following day the night before in the refrigerator (to avoid jamming up your machine)    Health Maintenance Adopting  a healthy lifestyle and getting preventive care can go a long way to promote health and wellness. Talk with your health care provider about what schedule of regular examinations is right for you. This is a good chance for you to check in with your provider about disease prevention and staying healthy. In between checkups, there are plenty of things you can do on your own. Experts have done a lot of research about which lifestyle changes and preventive measures are most likely to keep you healthy. Ask your health care provider for more information. WEIGHT AND DIET  Eat a healthy diet  Be sure to include plenty of vegetables, fruits, low-fat dairy products, and lean protein.  Do not eat a lot of foods high in solid fats, added sugars, or salt.  Get regular exercise. This is one of the most important things you can do for your health.  Most adults should exercise for at least 150 minutes each week. The exercise should increase your heart rate and make you sweat (moderate-intensity exercise).  Most adults should also do strengthening exercises at least twice a week. This is in addition to the moderate-intensity exercise.  Maintain a healthy weight  Body mass index (BMI) is a measurement that can be used to identify possible weight problems. It estimates body fat based  on height and weight. Your health care provider can help determine your BMI and help you achieve or maintain a healthy weight.  For females 73 years of age and older:   A BMI below 18.5 is considered underweight.  A BMI of 18.5 to 24.9 is normal.  A BMI of 25 to 29.9 is considered overweight.  A BMI of 30 and above is considered obese.  Watch levels of cholesterol and blood lipids  You should start having your blood tested for lipids and cholesterol at 46 years of age, then have this test every 5 years.  You may need to have your cholesterol levels checked more often if:  Your lipid or cholesterol levels are high.  You  are older than 46 years of age.  You are at high risk for heart disease.  CANCER SCREENING   Lung Cancer  Lung cancer screening is recommended for adults 22-37 years old who are at high risk for lung cancer because of a history of smoking.  A yearly low-dose CT scan of the lungs is recommended for people who:  Currently smoke.  Have quit within the past 15 years.  Have at least a 30-pack-year history of smoking. A pack year is smoking an average of one pack of cigarettes a day for 1 year.  Yearly screening should continue until it has been 15 years since you quit.  Yearly screening should stop if you develop a health problem that would prevent you from having lung cancer treatment.  Breast Cancer  Practice breast self-awareness. This means understanding how your breasts normally appear and feel.  It also means doing regular breast self-exams. Let your health care provider know about any changes, no matter how small.  If you are in your 20s or 30s, you should have a clinical breast exam (CBE) by a health care provider every 1-3 years as part of a regular health exam.  If you are 33 or older, have a CBE every year. Also consider having a breast X-ray (mammogram) every year.  If you have a family history of breast cancer, talk to your health care provider about genetic screening.  If you are at high risk for breast cancer, talk to your health care provider about having an MRI and a mammogram every year.  Breast cancer gene (BRCA) assessment is recommended for women who have family members with BRCA-related cancers. BRCA-related cancers include:  Breast.  Ovarian.  Tubal.  Peritoneal cancers.  Results of the assessment will determine the need for genetic counseling and BRCA1 and BRCA2 testing. Cervical Cancer Routine pelvic examinations to screen for cervical cancer are no longer recommended for nonpregnant women who are considered low risk for cancer of the pelvic organs  (ovaries, uterus, and vagina) and who do not have symptoms. A pelvic examination may be necessary if you have symptoms including those associated with pelvic infections. Ask your health care provider if a screening pelvic exam is right for you.   The Pap test is the screening test for cervical cancer for women who are considered at risk.  If you had a hysterectomy for a problem that was not cancer or a condition that could lead to cancer, then you no longer need Pap tests.  If you are older than 65 years, and you have had normal Pap tests for the past 10 years, you no longer need to have Pap tests.  If you have had past treatment for cervical cancer or a condition that could lead to  cancer, you need Pap tests and screening for cancer for at least 20 years after your treatment.  If you no longer get a Pap test, assess your risk factors if they change (such as having a new sexual partner). This can affect whether you should start being screened again.  Some women have medical problems that increase their chance of getting cervical cancer. If this is the case for you, your health care provider may recommend more frequent screening and Pap tests.  The human papillomavirus (HPV) test is another test that may be used for cervical cancer screening. The HPV test looks for the virus that can cause cell changes in the cervix. The cells collected during the Pap test can be tested for HPV.  The HPV test can be used to screen women 9 years of age and older. Getting tested for HPV can extend the interval between normal Pap tests from three to five years.  An HPV test also should be used to screen women of any age who have unclear Pap test results.  After 46 years of age, women should have HPV testing as often as Pap tests.  Colorectal Cancer  This type of cancer can be detected and often prevented.  Routine colorectal cancer screening usually begins at 46 years of age and continues through 46 years of  age.  Your health care provider may recommend screening at an earlier age if you have risk factors for colon cancer.  Your health care provider may also recommend using home test kits to check for hidden blood in the stool.  A small camera at the end of a tube can be used to examine your colon directly (sigmoidoscopy or colonoscopy). This is done to check for the earliest forms of colorectal cancer.  Routine screening usually begins at age 25.  Direct examination of the colon should be repeated every 5-10 years through 46 years of age. However, you may need to be screened more often if early forms of precancerous polyps or small growths are found. Skin Cancer  Check your skin from head to toe regularly.  Tell your health care provider about any new moles or changes in moles, especially if there is a change in a mole's shape or color.  Also tell your health care provider if you have a mole that is larger than the size of a pencil eraser.  Always use sunscreen. Apply sunscreen liberally and repeatedly throughout the day.  Protect yourself by wearing long sleeves, pants, a wide-brimmed hat, and sunglasses whenever you are outside. HEART DISEASE, DIABETES, AND HIGH BLOOD PRESSURE   Have your blood pressure checked at least every 1-2 years. High blood pressure causes heart disease and increases the risk of stroke.  If you are between 29 years and 36 years old, ask your health care provider if you should take aspirin to prevent strokes.  Have regular diabetes screenings. This involves taking a blood sample to check your fasting blood sugar level.  If you are at a normal weight and have a low risk for diabetes, have this test once every three years after 46 years of age.  If you are overweight and have a high risk for diabetes, consider being tested at a younger age or more often. PREVENTING INFECTION  Hepatitis B  If you have a higher risk for hepatitis B, you should be screened for  this virus. You are considered at high risk for hepatitis B if:  You were born in a country where hepatitis  B is common. Ask your health care provider which countries are considered high risk.  Your parents were born in a high-risk country, and you have not been immunized against hepatitis B (hepatitis B vaccine).  You have HIV or AIDS.  You use needles to inject street drugs.  You live with someone who has hepatitis B.  You have had sex with someone who has hepatitis B.  You get hemodialysis treatment.  You take certain medicines for conditions, including cancer, organ transplantation, and autoimmune conditions. Hepatitis C  Blood testing is recommended for:  Everyone born from [birth year] through 1967.  Anyone with known risk factors for hepatitis C. Sexually transmitted infections (STIs)  You should be screened for sexually transmitted infections (STIs) including gonorrhea and chlamydia if:  You are sexually active and are younger than 46 years of age.  You are older than 46 years of age and your health care provider tells you that you are at risk for this type of infection.  Your sexual activity has changed since you were last screened and you are at an increased risk for chlamydia or gonorrhea. Ask your health care provider if you are at risk.  If you do not have HIV, but are at risk, it may be recommended that you take a prescription medicine daily to prevent HIV infection. This is called pre-exposure prophylaxis (PrEP). You are considered at risk if:  You are sexually active and do not regularly use condoms or know the HIV status of your partner(s).  You take drugs by injection.  You are sexually active with a partner who has HIV. Talk with your health care provider about whether you are at high risk of being infected with HIV. If you choose to begin PrEP, you should first be tested for HIV. You should then be tested every 3 months for as long as you are taking PrEP.   PREGNANCY   If you are premenopausal and you may become pregnant, ask your health care provider about preconception counseling.  If you may become pregnant, take 400 to 800 micrograms (mcg) of folic acid every day.  If you want to prevent pregnancy, talk to your health care provider about birth control (contraception). OSTEOPOROSIS AND MENOPAUSE   Osteoporosis is a disease in which the bones lose minerals and strength with aging. This can result in serious bone fractures. Your risk for osteoporosis can be identified using a bone density scan.  If you are 88 years of age or older, or if you are at risk for osteoporosis and fractures, ask your health care provider if you should be screened.  Ask your health care provider whether you should take a calcium or vitamin D supplement to lower your risk for osteoporosis.  Menopause may have certain physical symptoms and risks.  Hormone replacement therapy may reduce some of these symptoms and risks. Talk to your health care provider about whether hormone replacement therapy is right for you.  HOME CARE INSTRUCTIONS   Schedule regular health, dental, and eye exams.  Stay current with your immunizations.   Do not use any tobacco products including cigarettes, chewing tobacco, or electronic cigarettes.  If you are pregnant, do not drink alcohol.  If you are breastfeeding, limit how much and how often you drink alcohol.  Limit alcohol intake to no more than 1 drink per day for nonpregnant women. One drink equals 12 ounces of beer, 5 ounces of wine, or 1 ounces of hard liquor.  Do not use street drugs.  Do not share needles.  Ask your health care provider for help if you need support or information about quitting drugs.  Tell your health care provider if you often feel depressed.  Tell your health care provider if you have ever been abused or do not feel safe at home. Document Released: 02/12/2011 Document Revised: 12/14/2013 Document  Reviewed: 07/01/2013 [facility] Patient Information 2015 [place], [place]. This information is not intended to replace advice given to you by your health care provider. Make sure you discuss any questions you have with your health care provider.

## 2015-03-21 NOTE — Progress Notes (Signed)
Patient ID: Makayla Brown, female    DOB: 1970-11-23  Age: 44 y.o. MRN: [id number]  The patient is here for annual  wellness examination and management of other chronic and acute problems.  Mother diagnosed with dementia,  At age 32, brother is now living with mother and some concern over his oppoutunistic behavior since he is unemployed.    Derm:  She sees [facility] G[facility]ens for annual screening,  several areas biopsied recently , including lesion on posterior right deltoid . Was referred to [facility] for eval of lesion on left nostril that was not accessible.  Marland KitchenUp to date on mammography and cerivcal CA screening.      The risk factors are reflected in the social history.  The roster of all physicians providing medical care to patient - is listed in the Snapshot section of the chart.  Activities of daily living:  The patient is 100% independent in all ADLs: dressing, toileting, feeding as well as independent mobility  Home safety : The patient has smoke detectors in the home. They wear seatbelts.  There are no firearms at home. There is no violence in the home.   There is no risks for hepatitis, STDs or HIV. There is no   history of blood transfusion. They have no travel history to infectious disease endemic areas of the world.  The patient has seen their dentist in the last six month. They have seen their eye doctor in the last year. They admit to slight hearing difficulty with regard to whispered voices and some television programs.  They have deferred audiologic testing in the last year.  They do not  have excessive sun exposure. Discussed the need for sun protection: hats, long sleeves and use of sunscreen if there is significant sun exposure.   Diet: the importance of a healthy diet is discussed. They do have a healthy diet.  The benefits of regular aerobic exercise were discussed. She walks 4 times per week ,  20 minutes.   Depression screen: there are no signs or vegative symptoms  of depression- irritability, change in appetite, anhedonia, sadness/tearfullness.  Cognitive assessment: the patient manages all their financial and personal affairs and is actively engaged. They could relate day,date,year and events; recalled 2/3 objects at 3 minutes; performed clock-face test normally.  The following portions of the patient's history were reviewed and updated as appropriate: allergies, current medications, past family history, past medical history,  past surgical history, past social history  and problem list.  Visual acuity was not assessed per patient preference since she has regular follow up with her ophthalmologist. Hearing and body mass index were assessed and reviewed.   During the course of the visit the patient was educated and counseled about appropriate screening and preventive services including : fall prevention , diabetes screening, nutrition counseling, colorectal cancer screening, and recommended immunizations.    CC: The primary encounter diagnosis was Breast cancer screening. Diagnoses of Acute upper respiratory infection, Obesity, Need for hepatitis C screening test, Cough, Viral URI, Routine general medical examination at a health care facility, Reflux gastritis, and Depression were also pertinent to this visit.   Cough : persistent, present since mid July,  Became worse  with chest cold  last Friday when she started to have URI symptoms.  No ear pain or puruelent sinus drainage,  No wheezing,  But had a temp last night 100.6 .  Works as an Nurse, learning disability at [employer] .   Weight gain: due to lack of  exercise and increased use of fast food during kitchen remodel.      History Makayla Brown has a past medical history of Depression and Headache(784.0).   She has past surgical history that includes Cholecystectomy (2005) and Tubal ligation (2010).   Her family history includes Arthritis in her maternal grandfather; COPD in her father; Cancer in her father and maternal  grandfather; Coronary artery disease in her father.She reports that she has never smoked. She has never used smokeless tobacco. She reports that she does not drink alcohol or use illicit drugs.  Outpatient Prescriptions Prior to Visit  Medication Sig Dispense Refill  . DULoxetine (CYMBALTA) 60 MG capsule TAKE ONE CAPSULE BY MOUTH ONCE DAILY 30 capsule 3  . amoxicillin-clavulanate (AUGMENTIN) 875-125 MG per tablet Take 1 tablet by mouth 2 (two) times daily. (Patient not taking: Reported on 03/21/2015) 14 tablet 0  . fluconazole (DIFLUCAN) 150 MG tablet Take 1 tablet (150 mg total) by mouth daily. (Patient not taking: Reported on 03/21/2015) 2 tablet 0  . fluconazole (DIFLUCAN) 150 MG tablet Take 1 tablet (150 mg total) by mouth daily. (Patient not taking: Reported on 03/21/2015) 2 tablet 0   No facility-administered medications prior to visit.    Review of Systems   Patient denies headache,malaise, unintentional weight loss, skin rash, eye pain, sinus congestion , sore throat, dysphagia,  hemoptysis , dyspnea, wheezing, chest pain, palpitations, orthopnea, edema, abdominal pain, nausea, melena, diarrhea, constipation, flank pain, dysuria, hematuria, urinary  Frequency, nocturia, numbness, tingling, seizures,  Focal weakness, Loss of consciousness,  Tremor, insomnia, depression, anxiety, and suicidal ideation.      Objective:  BP 124/82 mmHg  Pulse 62  Temp(Src) 98.1 F (36.7 C)  Resp 16  Ht 5\' 5"  (1.651 m)  Wt 221 lb (100.245 kg)  BMI 36.78 kg/m2  SpO2 98%  Physical Exam   General appearance: alert, cooperative and appears stated age Head: Normocephalic, without obvious abnormality, atraumatic Eyes: conjunctivae/corneas clear. PERRL, EOM's intact. Fundi benign. Ears: normal TM's and external ear canals both ears Nose: Nares normal. Septum midline. Mucosa normal. No drainage or sinus tenderness. Throat: lips, mucosa, and tongue normal; teeth and gums normal. No ulceration or tonsillar  edema/erythema Neck: no adenopathy, no carotid bruit, no JVD, supple, symmetrical, trachea midline and thyroid not enlarged, symmetric, no tenderness/mass/nodules Lungs: clear to auscultation bilaterally Breasts: normal appearance, no masses or tenderness Heart: regular rate and rhythm, S1, S2 normal, no murmur, click, rub or gallop Abdomen: soft, non-tender; bowel sounds normal; no masses,  no organomegaly Extremities: extremities normal, atraumatic, no cyanosis or edema Pulses: 2+ and symmetric Skin: Skin color, texture, turgor normal. No rashes or lesions Neurologic: Alert and oriented X 3, normal strength and tone. Normal symmetric reflexes. Normal coordination and gait.  .    Assessment & Plan:   Problem List Items Addressed This Visit      Unprioritized   Depression    History of sertraline intolerance.  Stable symptoms, Continue cymbalta  With prn alprazolam for severe anxiety. The risks and benefits of benzodiazepine use were discussed with patient today including excessive sedation leading to respiratory depression,  impaired thinking/driving, and addiction.  Patient was advised to avoid concurrent use with alcohol, to use medication only as needed and not to share with others  .         Relevant Medications   ALPRAZolam (XANAX) 0.25 MG tablet   Reflux gastritis    Discussed current controversy regarding prolonged use of PPI in patients without documented Barretts  esophagus.  Patient has no prior EGD but has been on PPI therapy for > 5 years (per patient).  Suggested trial of pepcid 20 mg daily.  If GERD symptoms return,  advised her to accept referral for EGD.       Routine general medical examination at a health care facility    Annual wellness  exam was done as well as a comprehensive physical exam  .  During the course of the visit the patient was educated and counseled about appropriate screening and preventive services and screenings were brought up to date for cervical  and breast cancer .  She will return for fasting labs to provide samples for diabetes screening and lipid analysis with projected  10 year  risk for CAD. nutrition counseling, skin cancer screening has been recommended, along with review of the age appropriate recommended immunizations.  Printed recommendations for health maintenance screenings was given.        Obesity    I have addressed her weight gain and elevated  BMI and recommended wt loss of 10% of body weigh over the next 6 months using a low glycemic index diet and regular exercise a minimum of 5 days per week.        Relevant Orders   Comprehensive metabolic panel (Completed)   TSH (Completed)   Lipid panel (Completed)   Viral URI    Current exam is consistent with viral URI .  Chest x ray was done due to history of persistent cough and was normal.  Recommended treatment with oral and topcal decongestants,and a 6 day prednisone  taper.  Advised to add antibiotic for development of ear pain, sinus  pain or purulent sinus drainage, or failure to improve in 48 hours. Probiotic advised if antibiotics are started.         Other Visit Diagnoses    Breast cancer screening    -  Primary    Relevant Orders    MM DIGITAL SCREENING BILATERAL    Acute upper respiratory infection        Relevant Orders    CBC with Differential/Platelet (Completed)    Need for hepatitis C screening test        Relevant Orders    Hepatitis C antibody (Completed)    Cough        Relevant Orders    DG Chest 2 View (Completed)    POCT urine pregnancy       I have discontinued Makayla Brown's fluconazole and fluconazole. I am also having her start on predniSONE. Additionally, I am having her maintain her DULoxetine, ALPRAZolam, omeprazole, and amoxicillin-clavulanate.  Meds ordered this encounter  Medications  . ALPRAZolam (XANAX) 0.25 MG tablet    Sig: Take 0.25 mg by mouth at bedtime as needed for anxiety.  Marland Kitchen omeprazole (PRILOSEC) 10 MG capsule     Sig: Take 10 mg by mouth daily.  Marland Kitchen amoxicillin-clavulanate (AUGMENTIN) 875-125 MG per tablet    Sig: Take 1 tablet by mouth 2 (two) times daily.    Dispense:  14 tablet    Refill:  0  . predniSONE (DELTASONE) 10 MG tablet    Sig: 6 tablets on Day 1 , then reduce by 1 tablet daily until gone    Dispense:  21 tablet    Refill:  0    Medications Discontinued During This Encounter  Medication Reason  . amoxicillin-clavulanate (AUGMENTIN) 875-125 MG per tablet Reorder  . fluconazole (DIFLUCAN) 150 MG tablet   . fluconazole (  DIFLUCAN) 150 MG tablet     Follow-up: No Follow-up on file.   Sherlene Shams, MD

## 2015-03-22 ENCOUNTER — Encounter: Payer: Self-pay | Admitting: Internal Medicine

## 2015-03-22 LAB — HEPATITIS C ANTIBODY: HCV AB: NEGATIVE

## 2015-03-22 NOTE — Assessment & Plan Note (Signed)
History of sertraline intolerance.  Stable symptoms, Continue cymbalta  With prn alprazolam for severe anxiety. The risks and benefits of benzodiazepine use were discussed with patient today including excessive sedation leading to respiratory depression,  impaired thinking/driving, and addiction.  Patient was advised to avoid concurrent use with alcohol, to use medication only as needed and not to share with others  .    

## 2015-03-22 NOTE — Assessment & Plan Note (Signed)
I have addressed her weight gain and elevated  BMI and recommended wt loss of 10% of body weigh over the next 6 months using a low glycemic index diet and regular exercise a minimum of 5 days per week.

## 2015-03-22 NOTE — Addendum Note (Signed)
Addended by: Sherlene Shams on: 03/22/2015 08:56 AM   Modules accepted: Kipp Brood

## 2015-03-22 NOTE — Assessment & Plan Note (Signed)
Current exam is consistent with viral URI .  Chest x ray was done due to history of persistent cough and was normal.  Recommended treatment with oral and topcal decongestants,and a 6 day prednisone  taper.  Advised to add antibiotic for development of ear pain, sinus  pain or purulent sinus drainage, or failure to improve in 48 hours. Probiotic advised if antibiotics are started.

## 2015-03-22 NOTE — Assessment & Plan Note (Signed)

## 2015-03-22 NOTE — Assessment & Plan Note (Signed)
Discussed current controversy regarding prolonged use of PPI in patients without documented Barretts esophagus.  Patient has no prior EGD but has been on PPI therapy for > 5 years (per patient).  Suggested trial of pepcid 20 mg daily.  If GERD symptoms return,  advised her to accept referral for EGD.  

## 2015-03-24 ENCOUNTER — Other Ambulatory Visit: Payer: Self-pay | Admitting: Internal Medicine

## 2015-03-25 NOTE — Telephone Encounter (Signed)
Ok to fill? Last OV 03/21/15

## 2015-03-25 NOTE — Telephone Encounter (Signed)
Ok to refill,  Refill sent  

## 2015-06-28 LAB — HM MAMMOGRAPHY: HM Mammogram: NEGATIVE

## 2016-03-06 ENCOUNTER — Other Ambulatory Visit: Payer: Self-pay | Admitting: Internal Medicine

## 2016-03-09 ENCOUNTER — Other Ambulatory Visit: Payer: Self-pay | Admitting: Internal Medicine

## 2016-04-23 ENCOUNTER — Other Ambulatory Visit: Payer: Self-pay | Admitting: Internal Medicine

## 2016-05-15 ENCOUNTER — Ambulatory Visit (INDEPENDENT_AMBULATORY_CARE_PROVIDER_SITE_OTHER): Payer: BC Managed Care – PPO | Admitting: Internal Medicine

## 2016-05-15 ENCOUNTER — Encounter: Payer: Self-pay | Admitting: Internal Medicine

## 2016-05-15 VITALS — BP 116/74 | HR 69 | Temp 98.0°F | Resp 10 | Ht 66.0 in | Wt 195.5 lb

## 2016-05-15 DIAGNOSIS — N926 Irregular menstruation, unspecified: Secondary | ICD-10-CM

## 2016-05-15 DIAGNOSIS — Z6835 Body mass index (BMI) 35.0-35.9, adult: Secondary | ICD-10-CM

## 2016-05-15 DIAGNOSIS — E782 Mixed hyperlipidemia: Secondary | ICD-10-CM | POA: Diagnosis not present

## 2016-05-15 DIAGNOSIS — Z124 Encounter for screening for malignant neoplasm of cervix: Secondary | ICD-10-CM

## 2016-05-15 DIAGNOSIS — E6609 Other obesity due to excess calories: Secondary | ICD-10-CM

## 2016-05-15 DIAGNOSIS — Z0000 Encounter for general adult medical examination without abnormal findings: Secondary | ICD-10-CM

## 2016-05-15 DIAGNOSIS — N951 Menopausal and female climacteric states: Secondary | ICD-10-CM

## 2016-05-15 NOTE — Progress Notes (Signed)
Pre-visit discussion using our clinic review tool. No additional management support is needed unless otherwise documented below in the visit note.  

## 2016-05-15 NOTE — Patient Instructions (Signed)
Congratulations on the weight loss!   Let me know if you need mammogram order  Health Maintenance, Female Adopting a healthy lifestyle and getting preventive care can go a long way to promote health and wellness. Talk with your health care provider about what schedule of regular examinations is right for you. This is a good chance for you to check in with your provider about disease prevention and staying healthy. In between checkups, there are plenty of things you can do on your own. Experts have done a lot of research about which lifestyle changes and preventive measures are most likely to keep you healthy. Ask your health care provider for more information. WEIGHT AND DIET  Eat a healthy diet  Be sure to include plenty of vegetables, fruits, low-fat dairy products, and lean protein.  Do not eat a lot of foods high in solid fats, added sugars, or salt.  Get regular exercise. This is one of the most important things you can do for your health.  Most adults should exercise for at least 150 minutes each week. The exercise should increase your heart rate and make you sweat (moderate-intensity exercise).  Most adults should also do strengthening exercises at least twice a week. This is in addition to the moderate-intensity exercise.  Maintain a healthy weight  Body mass index (BMI) is a measurement that can be used to identify possible weight problems. It estimates body fat based on height and weight. Your health care provider can help determine your BMI and help you achieve or maintain a healthy weight.  For females 4 years of age and older:   A BMI below 18.5 is considered underweight.  A BMI of 18.5 to 24.9 is normal.  A BMI of 25 to 29.9 is considered overweight.  A BMI of 30 and above is considered obese.  Watch levels of cholesterol and blood lipids  You should start having your blood tested for lipids and cholesterol at 47 years of age, then have this test every 5  years.  You may need to have your cholesterol levels checked more often if:  Your lipid or cholesterol levels are high.  You are older than 47 years of age.  You are at high risk for heart disease.  CANCER SCREENING   Lung Cancer  Lung cancer screening is recommended for adults 30-58 years old who are at high risk for lung cancer because of a history of smoking.  A yearly low-dose CT scan of the lungs is recommended for people who:  Currently smoke.  Have quit within the past 15 years.  Have at least a 30-pack-year history of smoking. A pack year is smoking an average of one pack of cigarettes a day for 1 year.  Yearly screening should continue until it has been 15 years since you quit.  Yearly screening should stop if you develop a health problem that would prevent you from having lung cancer treatment.  Breast Cancer  Practice breast self-awareness. This means understanding how your breasts normally appear and feel.  It also means doing regular breast self-exams. Let your health care provider know about any changes, no matter how small.  If you are in your 20s or 30s, you should have a clinical breast exam (CBE) by a health care provider every 1-3 years as part of a regular health exam.  If you are 45 or older, have a CBE every year. Also consider having a breast X-ray (mammogram) every year.  If you have a family  history of breast cancer, talk to your health care provider about genetic screening.  If you are at high risk for breast cancer, talk to your health care provider about having an MRI and a mammogram every year.  Breast cancer gene (BRCA) assessment is recommended for women who have family members with BRCA-related cancers. BRCA-related cancers include:  Breast.  Ovarian.  Tubal.  Peritoneal cancers.  Results of the assessment will determine the need for genetic counseling and BRCA1 and BRCA2 testing. Cervical Cancer Your health care provider may  recommend that you be screened regularly for cancer of the pelvic organs (ovaries, uterus, and vagina). This screening involves a pelvic examination, including checking for microscopic changes to the surface of your cervix (Pap test). You may be encouraged to have this screening done every 3 years, beginning at age 77.  For women ages 5-65, health care providers may recommend pelvic exams and Pap testing every 3 years, or they may recommend the Pap and pelvic exam, combined with testing for human papilloma virus (HPV), every 5 years. Some types of HPV increase your risk of cervical cancer. Testing for HPV may also be done on women of any age with unclear Pap test results.  Other health care providers may not recommend any screening for nonpregnant women who are considered low risk for pelvic cancer and who do not have symptoms. Ask your health care provider if a screening pelvic exam is right for you.  If you have had past treatment for cervical cancer or a condition that could lead to cancer, you need Pap tests and screening for cancer for at least 20 years after your treatment. If Pap tests have been discontinued, your risk factors (such as having a new sexual partner) need to be reassessed to determine if screening should resume. Some women have medical problems that increase the chance of getting cervical cancer. In these cases, your health care provider may recommend more frequent screening and Pap tests. Colorectal Cancer  This type of cancer can be detected and often prevented.  Routine colorectal cancer screening usually begins at 47 years of age and continues through 47 years of age.  Your health care provider may recommend screening at an earlier age if you have risk factors for colon cancer.  Your health care provider may also recommend using home test kits to check for hidden blood in the stool.  A small camera at the end of a tube can be used to examine your colon directly  (sigmoidoscopy or colonoscopy). This is done to check for the earliest forms of colorectal cancer.  Routine screening usually begins at age 34.  Direct examination of the colon should be repeated every 5-10 years through 47 years of age. However, you may need to be screened more often if early forms of precancerous polyps or small growths are found. Skin Cancer  Check your skin from head to toe regularly.  Tell your health care provider about any new moles or changes in moles, especially if there is a change in a mole's shape or color.  Also tell your health care provider if you have a mole that is larger than the size of a pencil eraser.  Always use sunscreen. Apply sunscreen liberally and repeatedly throughout the day.  Protect yourself by wearing long sleeves, pants, a wide-brimmed hat, and sunglasses whenever you are outside. HEART DISEASE, DIABETES, AND HIGH BLOOD PRESSURE   High blood pressure causes heart disease and increases the risk of stroke. High blood pressure  is more likely to develop in:  People who have blood pressure in the high end of the normal range (130-139/85-89 mm Hg).  People who are overweight or obese.  People who are African American.  If you are 20-25 years of age, have your blood pressure checked every 3-5 years. If you are 71 years of age or older, have your blood pressure checked every year. You should have your blood pressure measured twice--once when you are at a hospital or clinic, and once when you are not at a hospital or clinic. Record the average of the two measurements. To check your blood pressure when you are not at a hospital or clinic, you can use:  An automated blood pressure machine at a pharmacy.  A home blood pressure monitor.  If you are between 41 years and 9 years old, ask your health care provider if you should take aspirin to prevent strokes.  Have regular diabetes screenings. This involves taking a blood sample to check your  fasting blood sugar level.  If you are at a normal weight and have a low risk for diabetes, have this test once every three years after 47 years of age.  If you are overweight and have a high risk for diabetes, consider being tested at a younger age or more often. PREVENTING INFECTION  Hepatitis B  If you have a higher risk for hepatitis B, you should be screened for this virus. You are considered at high risk for hepatitis B if:  You were born in a country where hepatitis B is common. Ask your health care provider which countries are considered high risk.  Your parents were born in a high-risk country, and you have not been immunized against hepatitis B (hepatitis B vaccine).  You have HIV or AIDS.  You use needles to inject street drugs.  You live with someone who has hepatitis B.  You have had sex with someone who has hepatitis B.  You get hemodialysis treatment.  You take certain medicines for conditions, including cancer, organ transplantation, and autoimmune conditions. Hepatitis C  Blood testing is recommended for:  Everyone born from [birth year] through 1967.  Anyone with known risk factors for hepatitis C. Sexually transmitted infections (STIs)  You should be screened for sexually transmitted infections (STIs) including gonorrhea and chlamydia if:  You are sexually active and are younger than 47 years of age.  You are older than 47 years of age and your health care provider tells you that you are at risk for this type of infection.  Your sexual activity has changed since you were last screened and you are at an increased risk for chlamydia or gonorrhea. Ask your health care provider if you are at risk.  If you do not have HIV, but are at risk, it may be recommended that you take a prescription medicine daily to prevent HIV infection. This is called pre-exposure prophylaxis (PrEP). You are considered at risk if:  You are sexually active and do not regularly use condoms or  know the HIV status of your partner(s).  You take drugs by injection.  You are sexually active with a partner who has HIV. Talk with your health care provider about whether you are at high risk of being infected with HIV. If you choose to begin PrEP, you should first be tested for HIV. You should then be tested every 3 months for as long as you are taking PrEP.  PREGNANCY   If you are premenopausal and  you may become pregnant, ask your health care provider about preconception counseling.  If you may become pregnant, take 400 to 800 micrograms (mcg) of folic acid every day.  If you want to prevent pregnancy, talk to your health care provider about birth control (contraception). OSTEOPOROSIS AND MENOPAUSE   Osteoporosis is a disease in which the bones lose minerals and strength with aging. This can result in serious bone fractures. Your risk for osteoporosis can be identified using a bone density scan.  If you are 71 years of age or older, or if you are at risk for osteoporosis and fractures, ask your health care provider if you should be screened.  Ask your health care provider whether you should take a calcium or vitamin D supplement to lower your risk for osteoporosis.  Menopause may have certain physical symptoms and risks.  Hormone replacement therapy may reduce some of these symptoms and risks. Talk to your health care provider about whether hormone replacement therapy is right for you.  HOME CARE INSTRUCTIONS   Schedule regular health, dental, and eye exams.  Stay current with your immunizations.   Do not use any tobacco products including cigarettes, chewing tobacco, or electronic cigarettes.  If you are pregnant, do not drink alcohol.  If you are breastfeeding, limit how much and how often you drink alcohol.  Limit alcohol intake to no more than 1 drink per day for nonpregnant women. One drink equals 12 ounces of beer, 5 ounces of wine, or 1 ounces of hard liquor.  Do  not use street drugs.  Do not share needles.  Ask your health care provider for help if you need support or information about quitting drugs.  Tell your health care provider if you often feel depressed.  Tell your health care provider if you have ever been abused or do not feel safe at home.   This information is not intended to replace advice given to you by your health care provider. Make sure you discuss any questions you have with your health care provider.   Document Released: 02/12/2011 Document Revised: 08/20/2014 Document Reviewed: 07/01/2013 Elsevier Interactive Patient Education [employer].

## 2016-05-15 NOTE — Progress Notes (Signed)
Patient ID: Makayla Brown, female    DOB: 1970/11/15  Age: 45 y.o. MRN: [id number]  The patient is here for annual gyn  preventive examination and management of other chronic and acute problems.    PAP  Normal 2014 MAMMOGRAM [facility] NOV 2016, done on campus at [facility].   Marland Kitchen  No order needed supposedly .    The risk factors are reflected in the social history.  The roster of all physicians providing medical care to patient - is listed in the Snapshot section of the chart.   Home safety : The patient has smoke detectors in the home. They wear seatbelts.  There are no firearms at home. There is no violence in the home.   There is no risks for hepatitis, STDs or HIV. There is no   history of blood transfusion. They have no travel history to infectious disease endemic areas of the world.  The patient has seen their dentist in the last six month. They have seen their eye doctor in the last year.   Discussed the need for sun protection: hats, long sleeves and use of sunscreen if there is significant sun exposure.   Diet: the importance of a healthy diet is discussed. They do have a healthy diet.  The benefits of regular aerobic exercise were discussed. She works out 4 days per week, averaing an hour per session using a Systems analyst. g  Depression screen: there are no signs or vegative symptoms of depression- irritability, change in appetite, anhedonia, sadness/tearfullness.   The following portions of the patient's history were reviewed and updated as appropriate: allergies, current medications, past family history, past medical history,  past surgical history, past social history  and problem list.  Visual acuity was not assessed per patient preference since she has regular follow up with her ophthalmologist. Hearing and body mass index were assessed and reviewed.   During the course of the visit the patient was educated and counseled about appropriate screening and preventive services including  : fall prevention , diabetes screening, nutrition counseling, colorectal cancer screening, and recommended immunizations.    CC: The primary encounter diagnosis was Irregular menstrual cycle. Diagnoses of Mixed hyperlipidemia, Screening for cervical cancer, Class 2 obesity due to excess calories without serious comorbidity with body mass index (BMI) of 35.0 to 35.9 in adult, Encounter for preventive health examination, and Menopausal syndrome (hot flashes) were also pertinent to this visit.   NEW ONSET Night sweats started 6 months, ago,  Some during the day BUT MUCH MORE MILD. The night sweats are sleep disrupting .  Has had only 3 menses in the past year. Lasting Less than week,  Not real heavy .   Obesity:  Has lost 26 lbs since last August with diet and exercise.   History Makayla Brown has a past medical history of Depression and Headache(784.0).   She has a past surgical history that includes Cholecystectomy (2005) and Tubal ligation (2010).   Her family history includes Alzheimer's disease (age of onset: 22) in her mother; Arthritis in her maternal grandfather; COPD in her father; Cancer in her father and maternal grandfather; Coronary artery disease in her father.She reports that she has never smoked. She has never used smokeless tobacco. She reports that she does not drink alcohol or use drugs.  Outpatient Medications Prior to Visit  Medication Sig Dispense Refill  . ALPRAZolam (XANAX) 0.25 MG tablet Take 0.25 mg by mouth at bedtime as needed for anxiety.    . DULoxetine (CYMBALTA)  60 MG capsule TAKE ONE CAPSULE BY MOUTH ONCE DAILY 30 capsule 3  . DULoxetine (CYMBALTA) 60 MG capsule TAKE ONE CAPSULE BY MOUTH ONCE DAILY 30 capsule 0  . omeprazole (PRILOSEC) 10 MG capsule Take 10 mg by mouth daily.    Marland Kitchen. amoxicillin-clavulanate (AUGMENTIN) 875-125 MG per tablet Take 1 tablet by mouth 2 (two) times daily. 14 tablet 0  . predniSONE (DELTASONE) 10 MG tablet 6 tablets on Day 1 , then reduce by 1  tablet daily until gone 21 tablet 0   No facility-administered medications prior to visit.     Review of Systems   Patient denies headache, fevers, malaise, unintentional weight loss, skin rash, eye pain, sinus congestion and sinus pain, sore throat, dysphagia,  hemoptysis , cough, dyspnea, wheezing, chest pain, palpitations, orthopnea, edema, abdominal pain, nausea, melena, diarrhea, constipation, flank pain, dysuria, hematuria, urinary  Frequency, nocturia, numbness, tingling, seizures,  Focal weakness, Loss of consciousness,  Tremor, insomnia, depression, anxiety, and suicidal ideation.      Objective:  BP 116/74   Pulse 69   Temp 98 F (36.7 C) (Oral)   Resp 10   Ht 5\' 6"  (1.676 m)   Wt 195 lb 8 oz (88.7 kg)   LMP 02/13/2016 (Approximate)   SpO2 98%   BMI 31.55 kg/m   Physical Exam   General Appearance:    Alert, cooperative, no distress, appears stated age  Head:    Normocephalic, without obvious abnormality, atraumatic  Eyes:    PERRL, conjunctiva/corneas clear, EOM's intact, fundi    benign, both eyes  Ears:    Normal TM's and external ear canals, both ears  Nose:   Nares normal, septum midline, mucosa normal, no drainage    or sinus tenderness  Throat:   Lips, mucosa, and tongue normal; teeth and gums normal  Neck:   Supple, symmetrical, trachea midline, no adenopathy;    thyroid:  no enlargement/tenderness/nodules; no carotid   bruit or JVD  Back:     Symmetric, no curvature, ROM normal, no CVA tenderness  Lungs:     Clear to auscultation bilaterally, respirations unlabored  Chest Wall:    No tenderness or deformity   Heart:    Regular rate and rhythm, S1 and S2 normal, no murmur, rub   or gallop  Breast Exam:    No tenderness, masses, or nipple abnormality  Abdomen:     Soft, non-tender, bowel sounds active all four quadrants,    no masses, no organomegaly  Genitalia:    Pelvic: cervix normal in appearance, external genitalia normal, no adnexal masses or  tenderness, no cervical motion tenderness, rectovaginal septum normal, uterus normal size, shape, and consistency and vagina normal without discharge  Extremities:   Extremities normal, atraumatic, no cyanosis or edema  Pulses:   2+ and symmetric all extremities  Skin:   Skin color, texture, turgor normal, no rashes or lesions  Lymph nodes:   Cervical, supraclavicular, and axillary nodes normal  Neurologic:   CNII-XII intact, normal strength, sensation and reflexes    throughout      Assessment & Plan:   Problem List Items Addressed This Visit    Encounter for preventive health examination    Annual comprehensive preventive exam was done as well as an evaluation and management of chronic conditions .  During the course of the visit the patient was educated and counseled about appropriate screening and preventive services including :  diabetes screening, lipid analysis with projected  10 year  risk  for CAD , nutrition counseling, breast, cervical and colorectal cancer screening, and recommended immunizations.  Printed recommendations for health maintenance screenings was given.  Lab Results  Component Value Date   CHOL 171 05/15/2016   HDL 58.50 05/15/2016   LDLCALC 92 05/15/2016   TRIG 100.0 05/15/2016   CHOLHDL 3 05/15/2016   Lab Results  Component Value Date   TSH 1.15 05/15/2016         Obesity    I have congratulated her in reduction of   BMI and encouraged  Continued weight loss with goal of 10% of body weigh over the next 6 months using a low glycemic index diet and regular exercise a minimum of 5 days per week.        Menopausal syndrome (hot flashes)    thyrod functio nis normal.  FSH and LH confirm that she is peri menopausal.        Other Visit Diagnoses    Irregular menstrual cycle    -  Primary   Relevant Orders   Comprehensive metabolic panel (Completed)   TSH (Completed)   Follicle stimulating hormone (Completed)   LH (Completed)   Mixed hyperlipidemia        Relevant Orders   Lipid panel (Completed)   Screening for cervical cancer       Relevant Orders   Cytology - PAP      I have discontinued Ms. Kanno's amoxicillin-clavulanate and predniSONE. I am also having her maintain her DULoxetine, ALPRAZolam, omeprazole, DULoxetine, and cetirizine.  Meds ordered this encounter  Medications  . cetirizine (ZYRTEC) 10 MG tablet    Sig: Take 10 mg by mouth daily.    Medications Discontinued During This Encounter  Medication Reason  . amoxicillin-clavulanate (AUGMENTIN) 875-125 MG per tablet Completed Course  . predniSONE (DELTASONE) 10 MG tablet Completed Course    Follow-up: No Follow-up on file.   Sherlene Shams, MD

## 2016-05-16 ENCOUNTER — Other Ambulatory Visit (HOSPITAL_COMMUNITY)
Admission: RE | Admit: 2016-05-16 | Discharge: 2016-05-16 | Disposition: A | Discharge status: Home / Self Care | Payer: BC Managed Care – PPO | Source: Ambulatory Visit | Attending: Internal Medicine | Admitting: Internal Medicine

## 2016-05-16 DIAGNOSIS — Z01419 Encounter for gynecological examination (general) (routine) without abnormal findings: Secondary | ICD-10-CM | POA: Diagnosis present

## 2016-05-16 DIAGNOSIS — N951 Menopausal and female climacteric states: Secondary | ICD-10-CM | POA: Insufficient documentation

## 2016-05-16 DIAGNOSIS — Z1151 Encounter for screening for human papillomavirus (HPV): Secondary | ICD-10-CM | POA: Insufficient documentation

## 2016-05-16 LAB — LIPID PANEL
CHOL/HDL RATIO: 3
Cholesterol: 171 mg/dL (ref 0–200)
HDL: 58.5 mg/dL (ref >39.00)
LDL CALC: 92 mg/dL (ref 0–99)
NONHDL: 112.04
Triglycerides: 100 mg/dL (ref 0.0–149.0)
VLDL: 20 mg/dL (ref 0.0–40.0)

## 2016-05-16 LAB — TSH: TSH: 1.15 u[IU]/mL (ref 0.35–4.50)

## 2016-05-16 LAB — COMPREHENSIVE METABOLIC PANEL
ALT: 15 U/L (ref 0–35)
AST: 14 U/L (ref 0–37)
Albumin: 3.9 g/dL (ref 3.5–5.2)
Alkaline Phosphatase: 73 U/L (ref 39–117)
BILIRUBIN TOTAL: 0.3 mg/dL (ref 0.2–1.2)
BUN: 21 mg/dL (ref 6–23)
CALCIUM: 9.1 mg/dL (ref 8.4–10.5)
CHLORIDE: 105 meq/L (ref 96–112)
CO2: 30 meq/L (ref 19–32)
CREATININE: 0.92 mg/dL (ref 0.40–1.20)
GFR: 69.57 mL/min (ref >60.00)
GLUCOSE: 89 mg/dL (ref 70–99)
Potassium: 4.7 mEq/L (ref 3.5–5.1)
SODIUM: 141 meq/L (ref 135–145)
Total Protein: 6.8 g/dL (ref 6.0–8.3)

## 2016-05-16 LAB — LUTEINIZING HORMONE: LH: 34.17 m[IU]/mL

## 2016-05-16 LAB — FOLLICLE STIMULATING HORMONE: FSH: 71.6 m[IU]/mL

## 2016-05-16 NOTE — Assessment & Plan Note (Addendum)
thyrod functio nis normal.  FSH and LH confirm that she is peri menopausal.

## 2016-05-16 NOTE — Assessment & Plan Note (Addendum)
Annual comprehensive preventive exam was done as well as an evaluation and management of chronic conditions .  During the course of the visit the patient was educated and counseled about appropriate screening and preventive services including :  diabetes screening, lipid analysis with projected  10 year  risk for CAD , nutrition counseling, breast, cervical and colorectal cancer screening, and recommended immunizations.  Printed recommendations for health maintenance screenings was given.  Lab Results  Component Value Date   CHOL 171 05/15/2016   HDL 58.50 05/15/2016   LDLCALC 92 05/15/2016   TRIG 100.0 05/15/2016   CHOLHDL 3 05/15/2016   Lab Results  Component Value Date   TSH 1.15 05/15/2016

## 2016-05-16 NOTE — Assessment & Plan Note (Signed)
I have congratulated her in reduction of   BMI and encouraged  Continued weight loss with goal of 10% of body weigh over the next 6 months using a low glycemic index diet and regular exercise a minimum of 5 days per week.    

## 2016-05-17 ENCOUNTER — Encounter: Payer: Self-pay | Admitting: Internal Medicine

## 2016-05-17 LAB — CYTOLOGY - PAP

## 2016-05-18 ENCOUNTER — Encounter: Payer: Self-pay | Admitting: Internal Medicine

## 2016-05-22 ENCOUNTER — Encounter: Payer: Self-pay | Admitting: Internal Medicine

## 2016-05-22 DIAGNOSIS — J45990 Exercise induced bronchospasm: Secondary | ICD-10-CM | POA: Insufficient documentation

## 2016-05-22 MED ORDER — ALBUTEROL SULFATE HFA 108 (90 BASE) MCG/ACT IN AERS: INHALATION_SPRAY | RESPIRATORY_TRACT | 1 refills | Status: DC

## 2016-05-22 NOTE — Assessment & Plan Note (Signed)
Discussed trial of albuterol MDI.

## 2016-07-23 ENCOUNTER — Other Ambulatory Visit: Payer: Self-pay | Admitting: Internal Medicine

## 2016-08-18 IMAGING — CR DG CHEST 2V
2 series · 2 of 2 positions shown · non-contrast
Comparison: None.

CLINICAL DATA: Productive cough.

EXAM:
CHEST  2 VIEW

[view not recorded (1 of 2)]
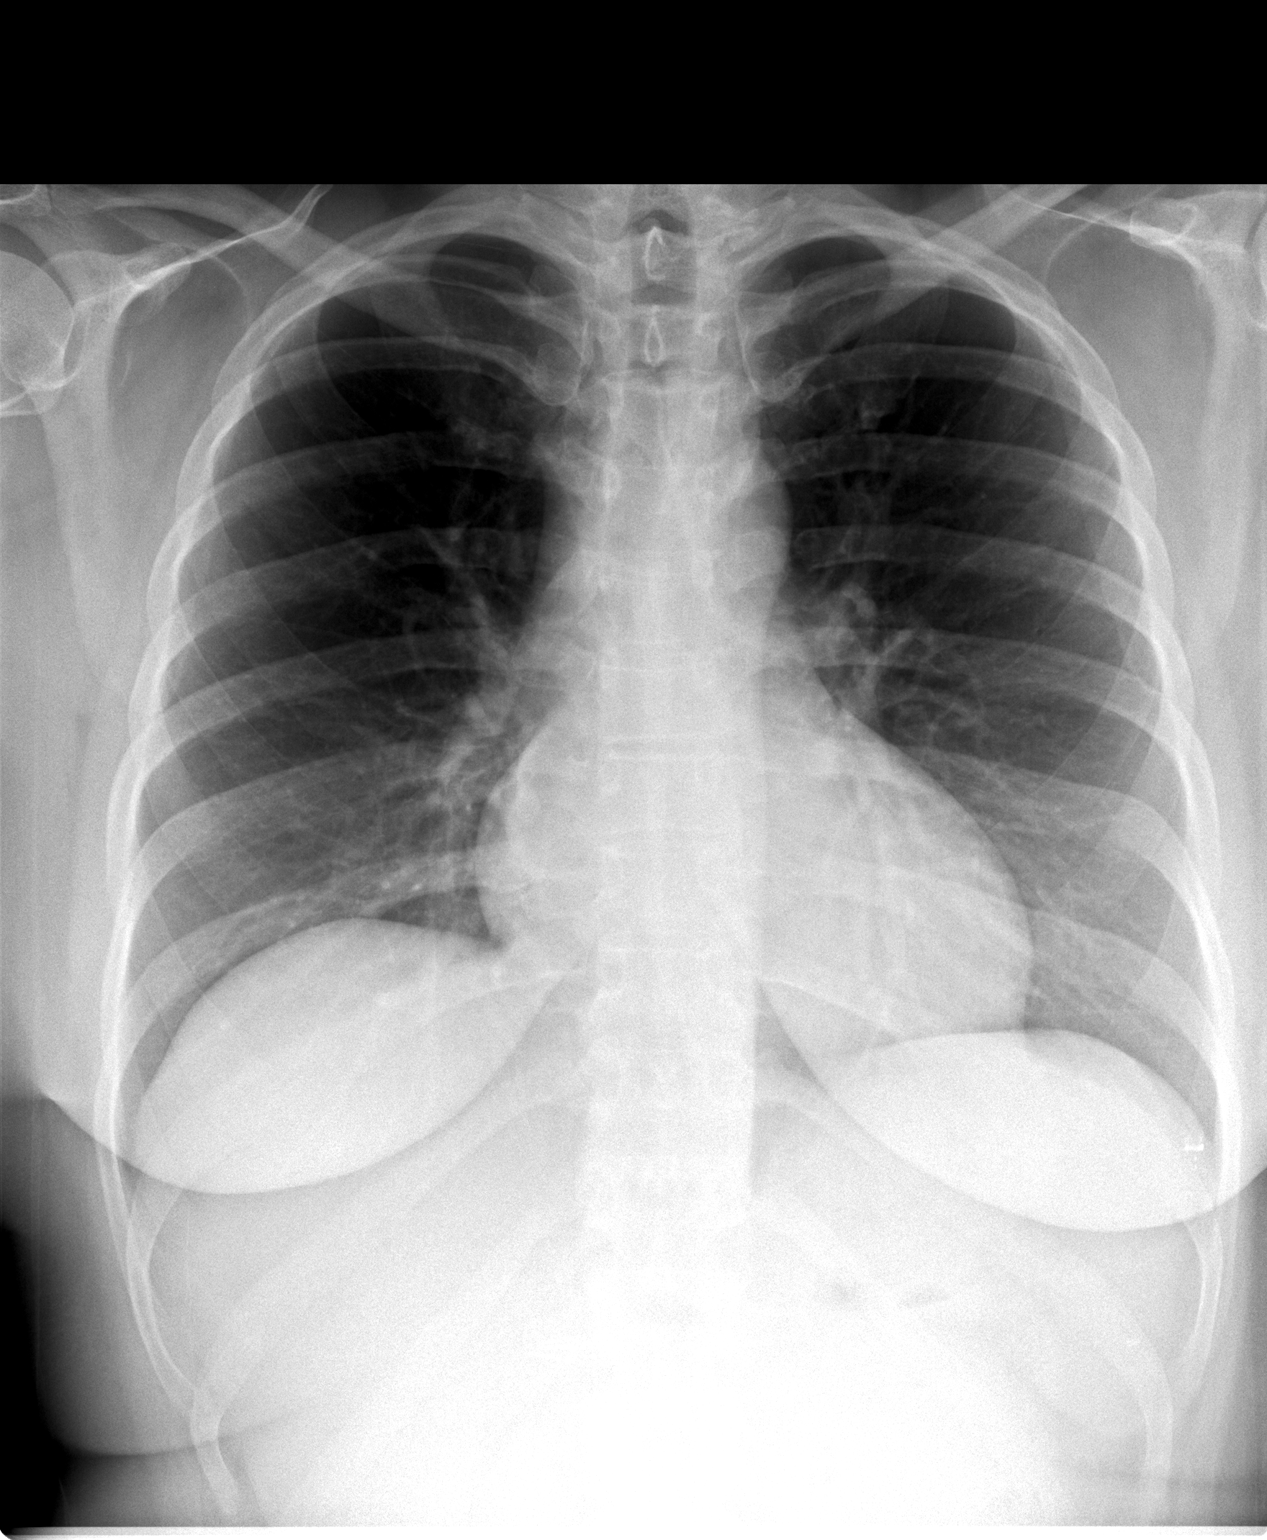

[view not recorded (2 of 2)]
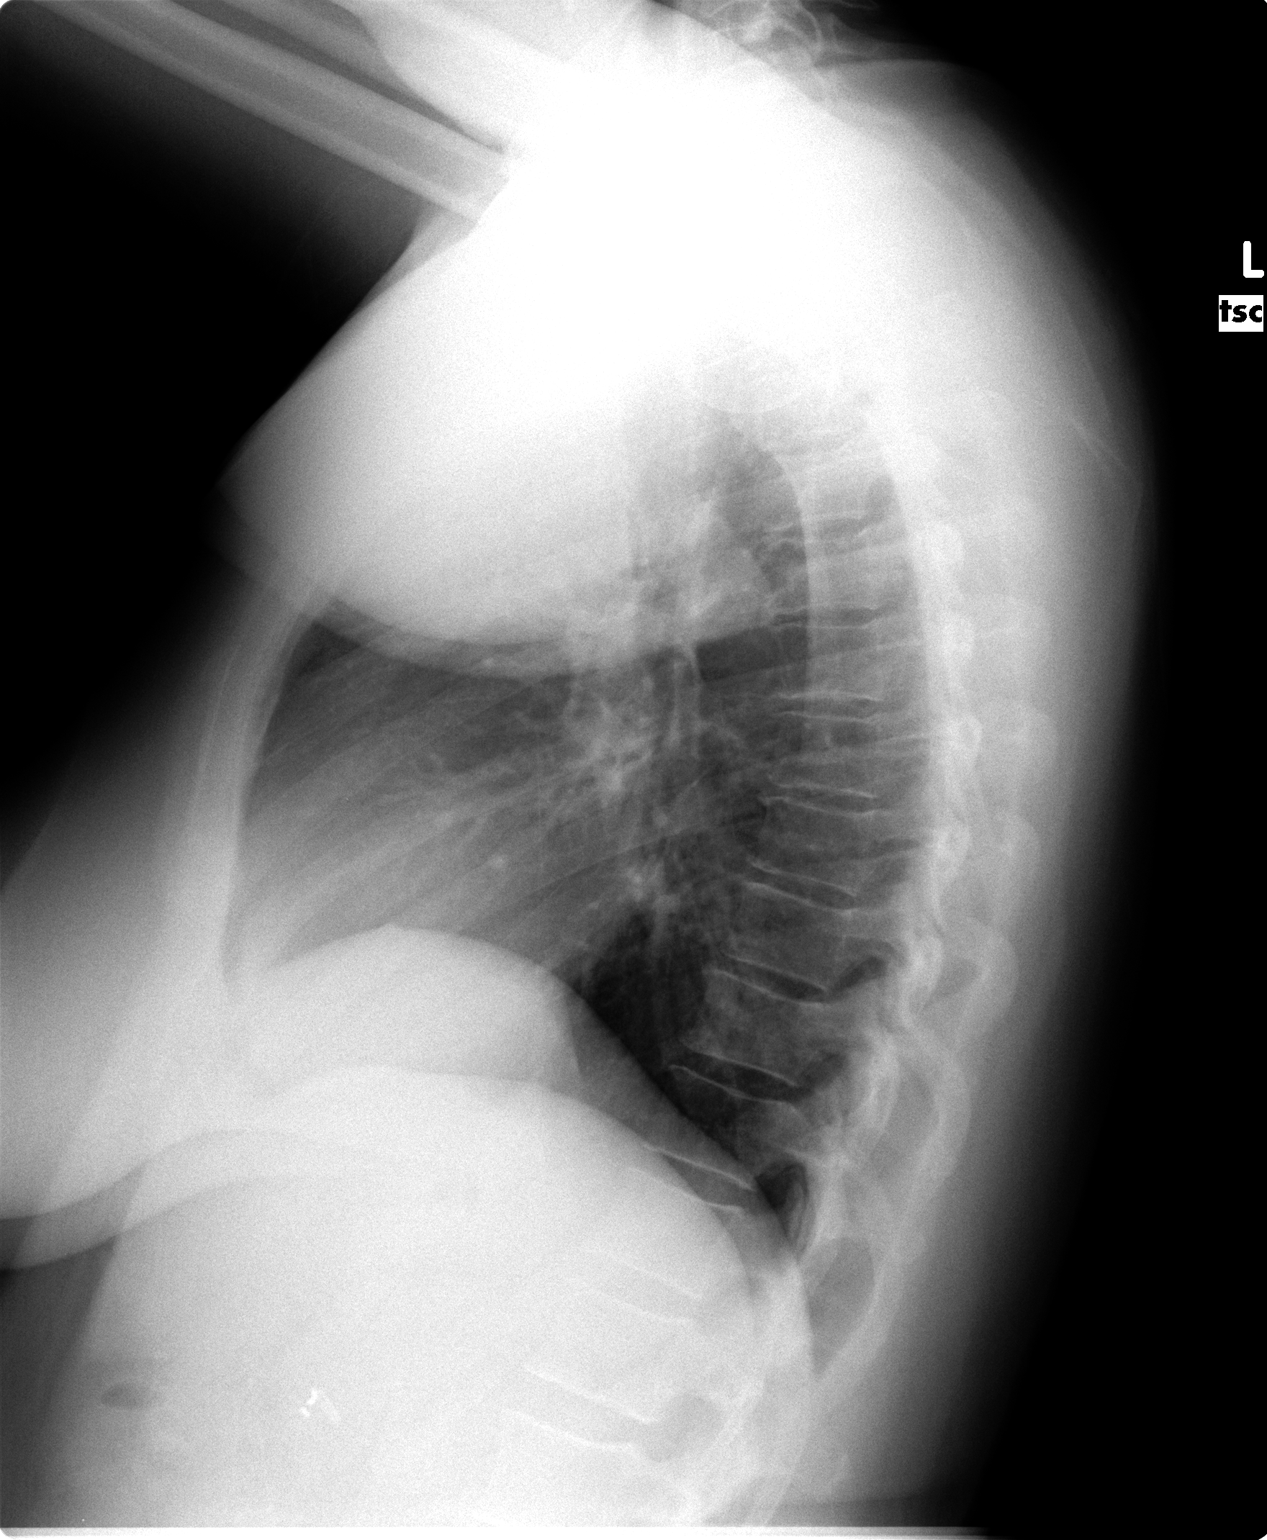

[2 of 2 positions shown; findings below may reference images not displayed]

FINDINGS: Mediastinum and hilar structures are normal. Lungs are clear. Heart
size normal. Surgical clips right upper quadrant. No acute bony
abnormality.
IMPRESSION: No acute cardiopulmonary disease.

## 2016-11-14 ENCOUNTER — Encounter: Payer: Self-pay | Admitting: Internal Medicine

## 2017-05-17 ENCOUNTER — Encounter: Payer: Self-pay | Admitting: Internal Medicine

## 2017-05-17 ENCOUNTER — Ambulatory Visit (INDEPENDENT_AMBULATORY_CARE_PROVIDER_SITE_OTHER): Payer: BC Managed Care – PPO | Admitting: Internal Medicine

## 2017-05-17 VITALS — BP 108/76 | HR 58 | Temp 98.4°F | Resp 15 | Ht 66.0 in | Wt 199.2 lb

## 2017-05-17 DIAGNOSIS — Z0000 Encounter for general adult medical examination without abnormal findings: Secondary | ICD-10-CM

## 2017-05-17 DIAGNOSIS — E6609 Other obesity due to excess calories: Secondary | ICD-10-CM | POA: Diagnosis not present

## 2017-05-17 DIAGNOSIS — E7800 Pure hypercholesterolemia, unspecified: Secondary | ICD-10-CM | POA: Diagnosis not present

## 2017-05-17 DIAGNOSIS — Z1231 Encounter for screening mammogram for malignant neoplasm of breast: Secondary | ICD-10-CM

## 2017-05-17 DIAGNOSIS — Z1239 Encounter for other screening for malignant neoplasm of breast: Secondary | ICD-10-CM

## 2017-05-17 DIAGNOSIS — R5383 Other fatigue: Secondary | ICD-10-CM | POA: Diagnosis not present

## 2017-05-17 DIAGNOSIS — N951 Menopausal and female climacteric states: Secondary | ICD-10-CM | POA: Diagnosis not present

## 2017-05-17 DIAGNOSIS — F3341 Major depressive disorder, recurrent, in partial remission: Secondary | ICD-10-CM

## 2017-05-17 DIAGNOSIS — Z6835 Body mass index (BMI) 35.0-35.9, adult: Secondary | ICD-10-CM

## 2017-05-17 LAB — COMPREHENSIVE METABOLIC PANEL
ALBUMIN: 4.5 g/dL (ref 3.5–5.2)
ALK PHOS: 79 U/L (ref 39–117)
ALT: 15 U/L (ref 0–35)
AST: 14 U/L (ref 0–37)
BUN: 18 mg/dL (ref 6–23)
CALCIUM: 9.8 mg/dL (ref 8.4–10.5)
CO2: 29 mEq/L (ref 19–32)
Chloride: 104 mEq/L (ref 96–112)
Creatinine, Ser: 0.9 mg/dL (ref 0.40–1.20)
GFR: 71.05 mL/min (ref >60.00)
Glucose, Bld: 95 mg/dL (ref 70–99)
POTASSIUM: 4.7 meq/L (ref 3.5–5.1)
Sodium: 140 mEq/L (ref 135–145)
TOTAL PROTEIN: 7.2 g/dL (ref 6.0–8.3)
Total Bilirubin: 0.6 mg/dL (ref 0.2–1.2)

## 2017-05-17 LAB — LIPID PANEL
CHOLESTEROL: 195 mg/dL (ref 0–200)
HDL: 57.9 mg/dL (ref >39.00)
LDL Cholesterol: 123 mg/dL — ABNORMAL HIGH (ref 0–99)
NonHDL: 137.08
Total CHOL/HDL Ratio: 3
Triglycerides: 69 mg/dL (ref 0.0–149.0)
VLDL: 13.8 mg/dL (ref 0.0–40.0)

## 2017-05-17 LAB — TSH: TSH: 2.52 u[IU]/mL (ref 0.35–4.50)

## 2017-05-17 NOTE — Patient Instructions (Addendum)
Good to see you!    Ask your mom's doctor abot using the home PT/INR program for coumadin management   Offered by [facility].  Uses fingerstick,  Results are automatically sent to MD by [facility] weekly  Mammogram has been ordered.    Black cohosh root and Evening Primrose are fine to try for menopause.  If no improvement,  We can try lexapro 5 mg daily    Health Maintenance for Postmenopausal Women Menopause is a normal process in which your reproductive ability comes to an end. This process happens gradually over a span of months to years, usually between the ages of 7 and 84. Menopause is complete when you have missed 12 consecutive menstrual periods. It is important to talk with your health care provider about some of the most common conditions that affect postmenopausal women, such as heart disease, cancer, and bone loss (osteoporosis). Adopting a healthy lifestyle and getting preventive care can help to promote your health and wellness. Those actions can also lower your chances of developing some of these common conditions. What should I know about menopause? During menopause, you may experience a number of symptoms, such as:  Moderate-to-severe hot flashes.  Night sweats.  Decrease in sex drive.  Mood swings.  Headaches.  Tiredness.  Irritability.  Memory problems.  Insomnia.  Choosing to treat or not to treat menopausal changes is an individual decision that you make with your health care provider. What should I know about hormone replacement therapy and supplements? Hormone therapy products are effective for treating symptoms that are associated with menopause, such as hot flashes and night sweats. Hormone replacement carries certain risks, especially as you become older. If you are thinking about using estrogen or estrogen with progestin treatments, discuss the benefits and risks with your health care provider. What should I know about heart disease and stroke? Heart  disease, heart attack, and stroke become more likely as you age. This may be due, in part, to the hormonal changes that your body experiences during menopause. These can affect how your body processes dietary fats, triglycerides, and cholesterol. Heart attack and stroke are both medical emergencies. There are many things that you can do to help prevent heart disease and stroke:  Have your blood pressure checked at least every 1-2 years. High blood pressure causes heart disease and increases the risk of stroke.  If you are 94-48 years old, ask your health care provider if you should take aspirin to prevent a heart attack or a stroke.  Do not use any tobacco products, including cigarettes, chewing tobacco, or electronic cigarettes. If you need help quitting, ask your health care provider.  It is important to eat a healthy diet and maintain a healthy weight. ? Be sure to include plenty of vegetables, fruits, low-fat dairy products, and lean protein. ? Avoid eating foods that are high in solid fats, added sugars, or salt (sodium).  Get regular exercise. This is one of the most important things that you can do for your health. ? Try to exercise for at least 150 minutes each week. The type of exercise that you do should increase your heart rate and make you sweat. This is known as moderate-intensity exercise. ? Try to do strengthening exercises at least twice each week. Do these in addition to the moderate-intensity exercise.  Know your numbers.Ask your health care provider to check your cholesterol and your blood glucose. Continue to have your blood tested as directed by your health care provider.  What  should I know about cancer screening? There are several types of cancer. Take the following steps to reduce your risk and to catch any cancer development as early as possible. Breast Cancer  Practice breast self-awareness. ? This means understanding how your breasts normally appear and feel. ? It  also means doing regular breast self-exams. Let your health care provider know about any changes, no matter how small.  If you are 36 or older, have a clinician do a breast exam (clinical breast exam or CBE) every year. Depending on your age, family history, and medical history, it may be recommended that you also have a yearly breast X-ray (mammogram).  If you have a family history of breast cancer, talk with your health care provider about genetic screening.  If you are at high risk for breast cancer, talk with your health care provider about having an MRI and a mammogram every year.  Breast cancer (BRCA) gene test is recommended for women who have family members with BRCA-related cancers. Results of the assessment will determine the need for genetic counseling and BRCA1 and for BRCA2 testing. BRCA-related cancers include these types: ? Breast. This occurs in males or females. ? Ovarian. ? Tubal. This may also be called fallopian tube cancer. ? Cancer of the abdominal or pelvic lining (peritoneal cancer). ? Prostate. ? Pancreatic.  Cervical, Uterine, and Ovarian Cancer Your health care provider may recommend that you be screened regularly for cancer of the pelvic organs. These include your ovaries, uterus, and vagina. This screening involves a pelvic exam, which includes checking for microscopic changes to the surface of your cervix (Pap test).  For women ages 21-65, health care providers may recommend a pelvic exam and a Pap test every three years. For women ages 66-65, they may recommend the Pap test and pelvic exam, combined with testing for human papilloma virus (HPV), every five years. Some types of HPV increase your risk of cervical cancer. Testing for HPV may also be done on women of any age who have unclear Pap test results.  Other health care providers may not recommend any screening for nonpregnant women who are considered low risk for pelvic cancer and have no symptoms. Ask your  health care provider if a screening pelvic exam is right for you.  If you have had past treatment for cervical cancer or a condition that could lead to cancer, you need Pap tests and screening for cancer for at least 20 years after your treatment. If Pap tests have been discontinued for you, your risk factors (such as having a new sexual partner) need to be reassessed to determine if you should start having screenings again. Some women have medical problems that increase the chance of getting cervical cancer. In these cases, your health care provider may recommend that you have screening and Pap tests more often.  If you have a family history of uterine cancer or ovarian cancer, talk with your health care provider about genetic screening.  If you have vaginal bleeding after reaching menopause, tell your health care provider.  There are currently no reliable tests available to screen for ovarian cancer.  Lung Cancer Lung cancer screening is recommended for adults 12-67 years old who are at high risk for lung cancer because of a history of smoking. A yearly low-dose CT scan of the lungs is recommended if you:  Currently smoke.  Have a history of at least 30 pack-years of smoking and you currently smoke or have quit within the past 15  years. A pack-year is smoking an average of one pack of cigarettes per day for one year.  Yearly screening should:  Continue until it has been 15 years since you quit.  Stop if you develop a health problem that would prevent you from having lung cancer treatment.  Colorectal Cancer  This type of cancer can be detected and can often be prevented.  Routine colorectal cancer screening usually begins at age 68 and continues through age 19.  If you have risk factors for colon cancer, your health care provider may recommend that you be screened at an earlier age.  If you have a family history of colorectal cancer, talk with your health care provider about genetic  screening.  Your health care provider may also recommend using home test kits to check for hidden blood in your stool.  A small camera at the end of a tube can be used to examine your colon directly (sigmoidoscopy or colonoscopy). This is done to check for the earliest forms of colorectal cancer.  Direct examination of the colon should be repeated every 5-10 years until age 64. However, if early forms of precancerous polyps or small growths are found or if you have a family history or genetic risk for colorectal cancer, you may need to be screened more often.  Skin Cancer  Check your skin from head to toe regularly.  Monitor any moles. Be sure to tell your health care provider: ? About any new moles or changes in moles, especially if there is a change in a mole's shape or color. ? If you have a mole that is larger than the size of a pencil eraser.  If any of your family members has a history of skin cancer, especially at a young age, talk with your health care provider about genetic screening.  Always use sunscreen. Apply sunscreen liberally and repeatedly throughout the day.  Whenever you are outside, protect yourself by wearing long sleeves, pants, a wide-brimmed hat, and sunglasses.  What should I know about osteoporosis? Osteoporosis is a condition in which bone destruction happens more quickly than new bone creation. After menopause, you may be at an increased risk for osteoporosis. To help prevent osteoporosis or the bone fractures that can happen because of osteoporosis, the following is recommended:  If you are 1-52 years old, get at least 1,000 mg of calcium and at least 600 mg of vitamin D per day.  If you are older than age 30 but younger than age 59, get at least 1,200 mg of calcium and at least 600 mg of vitamin D per day.  If you are older than age 32, get at least 1,200 mg of calcium and at least 800 mg of vitamin D per day.  Smoking and excessive alcohol intake  increase the risk of osteoporosis. Eat foods that are rich in calcium and vitamin D, and do weight-bearing exercises several times each week as directed by your health care provider. What should I know about how menopause affects my mental health? Depression may occur at any age, but it is more common as you become older. Common symptoms of depression include:  Low or sad mood.  Changes in sleep patterns.  Changes in appetite or eating patterns.  Feeling an overall lack of motivation or enjoyment of activities that you previously enjoyed.  Frequent crying spells.  Talk with your health care provider if you think that you are experiencing depression. What should I know about immunizations? It is important that  you get and maintain your immunizations. These include:  Tetanus, diphtheria, and pertussis (Tdap) booster vaccine.  Influenza every year before the flu season begins.  Pneumonia vaccine.  Shingles vaccine.  Your health care provider may also recommend other immunizations. This information is not intended to replace advice given to you by your health care provider. Make sure you discuss any questions you have with your health care provider. Document Released: 09/21/2005 Document Revised: 02/17/2016 Document Reviewed: 05/03/2015 Elsevier Interactive Patient Education  2018 [employer].

## 2017-05-17 NOTE — Progress Notes (Signed)
Patient ID: Makayla Brown, female    DOB: May 30, 1971  Age: 46 y.o. MRN: [id number]  The patient is here for annual preventive examination and management of other chronic and acute problems.   PAP NORMAL 2017 Mammogram  unc nov 2017 normal  Menopause official last year,  Hot flahses worsening,    Has been tested for HIV in the past   The risk factors are reflected in the social history.  The roster of all physicians providing medical care to patient - is listed in the Snapshot section of the chart.  Activities of daily living:  The patient is 100% independent in all ADLs: dressing, toileting, feeding as well as independent mobility  Home safety : The patient has smoke detectors in the home. They wear seatbelts.  There are no firearms at home. There is no violence in the home.   There is no risks for hepatitis, STDs or HIV. There is no   history of blood transfusion. They have no travel history to infectious disease endemic areas of the world.  The patient has seen their dentist in the last six month. They have seen their eye doctor in the last year.   . Discussed the need for sun protection: hats, long sleeves and use of sunscreen if there is significant sun exposure.   Diet: the importance of a healthy diet is discussed. They do have a healthy diet.  The benefits of regular aerobic exercise were discussed. She walks 4 times per week ,  20 minutes.   Depression screen: there are no signs or vegative symptoms of untreated depression- irritability, change in appetite, anhedonia, sadness/tearfullness.  The following portions of the patient's history were reviewed and updated as appropriate: allergies, current medications, past family history, past medical history,  past surgical history, past social history  and problem list.  Visual acuity was not assessed per patient preference since she has regular follow up with her ophthalmologist. Hearing and body mass index were assessed and  reviewed.   During the course of the visit the patient was educated and counseled about appropriate screening and preventive services including : fall prevention , diabetes screening, nutrition counseling, colorectal cancer screening, and recommended immunizations.    CC: There were no encounter diagnoses.  Hot flashes .  Discussed natural  Remedies.  Taking cymbalta.   History Makayla Brown has a past medical history of Depression and Headache(784.0).   She has a past surgical history that includes Cholecystectomy (2005) and Tubal ligation (2010).   Her family history includes Alzheimer's disease (age of onset: 20) in her mother; Arthritis in her maternal grandfather; COPD in her father; Cancer in her father and maternal grandfather; Coronary artery disease in her father.She reports that she has never smoked. She has never used smokeless tobacco. She reports that she does not drink alcohol or use drugs.  Outpatient Medications Prior to Visit  Medication Sig Dispense Refill  . cetirizine (ZYRTEC) 10 MG tablet Take 10 mg by mouth daily.    . DULoxetine (CYMBALTA) 60 MG capsule TAKE ONE CAPSULE BY MOUTH ONCE DAILY 30 capsule 3  . omeprazole (PRILOSEC) 10 MG capsule Take 10 mg by mouth daily.    Marland Kitchen ALPRAZolam (XANAX) 0.25 MG tablet Take 0.25 mg by mouth at bedtime as needed for anxiety.    Marland Kitchen albuterol (PROAIR HFA) 108 (90 Base) MCG/ACT inhaler 2 puffs 30 minutes prior to exercising (Patient not taking: Reported on 05/17/2017) 6.7 g 1  . DULoxetine (CYMBALTA) 60 MG capsule TAKE  ONE CAPSULE BY MOUTH ONCE DAILY (Patient not taking: Reported on 05/17/2017) 30 capsule 5   No facility-administered medications prior to visit.     Review of Systems   Patient denies headache, fevers, malaise, unintentional weight loss, skin rash, eye pain, sinus congestion and sinus pain, sore throat, dysphagia,  hemoptysis , cough, dyspnea, wheezing, chest pain, palpitations, orthopnea, edema, abdominal pain, nausea, melena,  diarrhea, constipation, flank pain, dysuria, hematuria, urinary  Frequency, nocturia, numbness, tingling, seizures,  Focal weakness, Loss of consciousness,  Tremor, insomnia, depression, anxiety, and suicidal ideation.      Objective:  BP 108/76 (BP Location: Left Arm, Patient Position: Sitting, Cuff Size: Large)   Pulse (!) 58   Temp 98.4 F (36.9 C) (Oral)   Resp 15   Ht  (1.676 m)   Wt 199 lb 3.2 oz (90.4 kg)   SpO2 98%   BMI 32.15 kg/m   Physical Exam   General appearance: alert, cooperative and appears stated age Head: Normocephalic, without obvious abnormality, atraumatic Eyes: conjunctivae/corneas clear. PERRL, EOM's intact. Fundi benign. Ears: normal TM's and external ear canals both ears Nose: Nares normal. Septum midline. Mucosa normal. No drainage or sinus tenderness. Throat: lips, mucosa, and tongue normal; teeth and gums normal Neck: no adenopathy, no carotid bruit, no JVD, supple, symmetrical, trachea midline and thyroid not enlarged, symmetric, no tenderness/mass/nodules Lungs: clear to auscultation bilaterally Breasts: normal appearance, no masses or tenderness Heart: regular rate and rhythm, S1, S2 normal, no murmur, click, rub or gallop Abdomen: soft, non-tender; bowel sounds normal; no masses,  no organomegaly Extremities: extremities normal, atraumatic, no cyanosis or edema Pulses: 2+ and symmetric Skin: Skin color, texture, turgor normal. No rashes or lesions Neurologic: Alert and oriented X 3, normal strength and tone. Normal symmetric reflexes. Normal coordination and gait.      Assessment & Plan:   Problem List Items Addressed This Visit    None      I have discontinued Ms. Haertel's albuterol. I am also having her maintain her DULoxetine, ALPRAZolam, omeprazole, and cetirizine.  No orders of the defined types were placed in this encounter.   Medications Discontinued During This Encounter  Medication Reason  . albuterol (PROAIR HFA) 108  (90 Base) MCG/ACT inhaler Patient has not taken in last 30 days  . DULoxetine (CYMBALTA) 60 MG capsule Duplicate    Follow-up: No Follow-up on file.   Sherlene Shams, MD

## 2017-05-19 ENCOUNTER — Encounter: Payer: Self-pay | Admitting: Internal Medicine

## 2017-05-19 NOTE — Assessment & Plan Note (Signed)
Annual comprehensive preventive exam was done as well as an evaluation and management of chronic conditions .  During the course of the visit the patient was educated and counseled about appropriate screening and preventive services including :  diabetes screening, lipid analysis with projected  10 year  risk for CAD , nutrition counseling, breast, cervical and colorectal cancer screening, and recommended immunizations.  Printed recommendations for health maintenance screenings was given:  Mammogram due Nov 2018, has been ordered at [facility]  3 drecommended

## 2017-05-19 NOTE — Assessment & Plan Note (Signed)
Natural non hormonal supplements discussed.

## 2017-05-19 NOTE — Assessment & Plan Note (Addendum)
I have congratulated her in loss of over 20 lbs and reduction of   BMI and encouraged  Continued weight loss with goal of 10% of body weigh over the next 6 months using a low glycemic index diet and regular exercise a minimum of 5 days per week.

## 2017-05-19 NOTE — Assessment & Plan Note (Signed)
History of sertraline intolerance.  Stable symptoms, Continue cymbalta  With prn alprazolam for severe anxiety. The risks and benefits of benzodiazepine use were discussed with patient today including excessive sedation leading to respiratory depression,  impaired thinking/driving, and addiction.  Patient was advised to avoid concurrent use with alcohol, to use medication only as needed and not to share with others  .

## 2017-07-11 LAB — HM MAMMOGRAPHY

## 2017-07-29 ENCOUNTER — Other Ambulatory Visit: Payer: Self-pay | Admitting: Internal Medicine

## 2017-12-02 ENCOUNTER — Encounter: Payer: Self-pay | Admitting: Internal Medicine

## 2017-12-12 ENCOUNTER — Ambulatory Visit: Payer: BC Managed Care – PPO | Admitting: Internal Medicine

## 2017-12-12 ENCOUNTER — Encounter: Payer: Self-pay | Admitting: Internal Medicine

## 2017-12-12 VITALS — BP 110/74 | HR 64 | Temp 98.1°F | Resp 15 | Ht 66.0 in | Wt 203.6 lb

## 2017-12-12 DIAGNOSIS — R519 Headache, unspecified: Secondary | ICD-10-CM

## 2017-12-12 DIAGNOSIS — R42 Dizziness and giddiness: Secondary | ICD-10-CM

## 2017-12-12 DIAGNOSIS — R51 Headache: Secondary | ICD-10-CM

## 2017-12-12 DIAGNOSIS — H538 Other visual disturbances: Secondary | ICD-10-CM

## 2017-12-12 DIAGNOSIS — R110 Nausea: Secondary | ICD-10-CM

## 2017-12-12 LAB — CBC WITH DIFFERENTIAL/PLATELET
BASOS PCT: 0.7 % (ref 0.0–3.0)
Basophils Absolute: 0 10*3/uL (ref 0.0–0.1)
EOS PCT: 1.5 % (ref 0.0–5.0)
Eosinophils Absolute: 0.1 10*3/uL (ref 0.0–0.7)
HEMATOCRIT: 37.3 % (ref 36.0–46.0)
HEMOGLOBIN: 12.6 g/dL (ref 12.0–15.0)
LYMPHS PCT: 36.6 % (ref 12.0–46.0)
Lymphs Abs: 2.1 10*3/uL (ref 0.7–4.0)
MCHC: 33.9 g/dL (ref 30.0–36.0)
MCV: 87.1 fl (ref 78.0–100.0)
MONO ABS: 0.3 10*3/uL (ref 0.1–1.0)
MONOS PCT: 5.9 % (ref 3.0–12.0)
Neutro Abs: 3.2 10*3/uL (ref 1.4–7.7)
Neutrophils Relative %: 55.3 % (ref 43.0–77.0)
Platelets: 255 10*3/uL (ref 150.0–400.0)
RBC: 4.28 Mil/uL (ref 3.87–5.11)
RDW: 13.5 % (ref 11.5–15.5)
WBC: 5.8 10*3/uL (ref 4.0–10.5)

## 2017-12-12 LAB — COMPREHENSIVE METABOLIC PANEL
ALBUMIN: 4.3 g/dL (ref 3.5–5.2)
ALK PHOS: 79 U/L (ref 39–117)
ALT: 14 U/L (ref 0–35)
AST: 14 U/L (ref 0–37)
BUN: 17 mg/dL (ref 6–23)
CALCIUM: 9.4 mg/dL (ref 8.4–10.5)
CHLORIDE: 105 meq/L (ref 96–112)
CO2: 29 mEq/L (ref 19–32)
Creatinine, Ser: 0.91 mg/dL (ref 0.40–1.20)
GFR: 69.98 mL/min (ref >60.00)
Glucose, Bld: 92 mg/dL (ref 70–99)
POTASSIUM: 4.5 meq/L (ref 3.5–5.1)
Sodium: 141 mEq/L (ref 135–145)
TOTAL PROTEIN: 6.9 g/dL (ref 6.0–8.3)
Total Bilirubin: 0.4 mg/dL (ref 0.2–1.2)

## 2017-12-12 LAB — C-REACTIVE PROTEIN: CRP: 0.7 mg/dL (ref 0.5–20.0)

## 2017-12-12 LAB — POCT URINE PREGNANCY: PREG TEST UR: NEGATIVE

## 2017-12-12 LAB — SEDIMENTATION RATE: SED RATE: 16 mm/h (ref 0–20)

## 2017-12-12 MED ORDER — TRIAMCINOLONE ACETONIDE 0.1 % EX CREA: 1.0000 "application " | TOPICAL_CREAM | Freq: Two times a day (BID) | CUTANEOUS | 0 refills | Status: DC

## 2017-12-12 NOTE — Progress Notes (Signed)
Subjective:  Patient ID: Makayla Brown, female    DOB: [birth year]  Age: 47 y.o. MRN: [id number]  CC: The primary encounter diagnosis was Acute intractable headache, unspecified headache type. Diagnoses of Nausea, Blurred vision, Dizziness, and Dizziness, nonspecific were also pertinent to this visit.  HPI Makayla Brown presents for recent onset  Of recurrent dizziness accompanied by nausea, mild right sided headaches and blurred vision.  Started April 22.   Saw optometrist ,  No vision changes .  bp has been normal   3 episodes thus far including  blurred vision accompanied by mild right sided headache.episodes have occurred at home and at work  Outpatient Medications Prior to Visit  Medication Sig Dispense Refill  . cetirizine (ZYRTEC) 10 MG tablet Take 10 mg by mouth daily.    . DULoxetine (CYMBALTA) 60 MG capsule TAKE ONE CAPSULE BY MOUTH ONCE DAILY 30 capsule 3  . guaiFENesin (MUCINEX) 600 MG 12 hr tablet Take 600 mg by mouth 2 (two) times daily.    Marland Kitchen ALPRAZolam (XANAX) 0.25 MG tablet Take 0.25 mg by mouth at bedtime as needed for anxiety.    . DULoxetine (CYMBALTA) 60 MG capsule TAKE ONE CAPSULE BY MOUTH ONCE DAILY (Patient not taking: Reported on 12/12/2017) 90 capsule 1  . omeprazole (PRILOSEC) 10 MG capsule Take 10 mg by mouth daily.     No facility-administered medications prior to visit.     Review of Systems;  Patient denies , fevers, malaise, unintentional weight loss, skin rash, eye pain, sinus congestion and sinus pain, sore throat, dysphagia,  hemoptysis , cough, dyspnea, wheezing, chest pain, palpitations, orthopnea, edema, abdominal pain, nausea, melena, diarrhea, constipation, flank pain, dysuria, hematuria, urinary  Frequency, nocturia, numbness, tingling, seizures,  Focal weakness, Loss of consciousness,  Tremor, insomnia, depression, anxiety, and suicidal ideation.      Objective:  BP 110/74 (BP Location: Left Arm, Patient Position: Sitting, Cuff Size: Large)    Pulse 64   Temp 98.1 F (36.7 C) (Oral)   Resp 15   Ht  (1.676 m)   Wt 203 lb 9.6 oz (92.4 kg)   SpO2 98%   BMI 32.86 kg/m   BP Readings from Last 3 Encounters:  12/12/17 110/74  05/17/17 108/76  05/15/16 116/74    Wt Readings from Last 3 Encounters:  12/12/17 203 lb 9.6 oz (92.4 kg)  05/17/17 199 lb 3.2 oz (90.4 kg)  05/15/16 195 lb 8 oz (88.7 kg)    General appearance: alert, cooperative and appears stated age Ears: normal TM's and external ear canals both ears Throat: lips, mucosa, and tongue normal; teeth and gums normal Neck: no adenopathy, no carotid bruit, supple, symmetrical, trachea midline and thyroid not enlarged, symmetric, no tenderness/mass/nodules Back: symmetric, no curvature. ROM normal. No CVA tenderness. Lungs: clear to auscultation bilaterally Heart: regular rate and rhythm, S1, S2 normal, no murmur, click, rub or gallop Abdomen: soft, non-tender; bowel sounds normal; no masses,  no organomegaly Pulses: 2+ and symmetric Skin: Skin color, texture, turgor normal. No rashes or lesions Lymph nodes: Cervical, supraclavicular, and axillary nodes normal. Neuro: CNs 2-12 intact. DTRs 2+/4 in biceps, brachioradialis, patellars and achilles. Muscle strength 5/5 in upper and lower exremities. Fine resting tremor bilaterally both hands cerebellar function normal. Romberg negative.  No pronator drift.   Gait normal.   No results found for: HGBA1C  Lab Results  Component Value Date   CREATININE 0.91 12/12/2017   CREATININE 0.90 05/17/2017   CREATININE 0.92 05/15/2016  Lab Results  Component Value Date   WBC 5.8 12/12/2017   HGB 12.6 12/12/2017   HCT 37.3 12/12/2017   PLT 255.0 12/12/2017   GLUCOSE 92 12/12/2017   CHOL 195 05/17/2017   TRIG 69.0 05/17/2017   HDL 57.90 05/17/2017   LDLCALC 123 (H) 05/17/2017   ALT 14 12/12/2017   AST 14 12/12/2017   NA 141 12/12/2017   K 4.5 12/12/2017   CL 105 12/12/2017   CREATININE 0.91 12/12/2017   BUN 17  12/12/2017   CO2 29 12/12/2017   TSH 2.52 05/17/2017    No results found.  Assessment & Plan:   Problem List Items Addressed This Visit    Dizziness, nonspecific    Etiology may be BPPV.  Neuro and HEENT are  exam normal.  . Vasculitis has been ruled out with labs due to concurrent reports of headache and vision changes.  Will send to ENT for table therapy and if persistent,  MRI brain. .    Lab Results  Component Value Date   ESRSEDRATE 16 12/12/2017   Lab Results  Component Value Date   CRP 0.7 12/12/2017   Lab Results  Component Value Date   WBC 5.8 12/12/2017   HGB 12.6 12/12/2017   HCT 37.3 12/12/2017   MCV 87.1 12/12/2017   PLT 255.0 12/12/2017           Other Visit Diagnoses    Acute intractable headache, unspecified headache type    -  Primary   Relevant Orders   Sedimentation rate (Completed)   C-reactive protein (Completed)   CBC with Differential/Platelet (Completed)   Nausea       Relevant Orders   Comprehensive metabolic panel (Completed)   POCT urine pregnancy (Completed)   Blurred vision       Relevant Orders   Hemoglobin A1c   Dizziness       Relevant Orders   POCT urine pregnancy (Completed)      I have discontinued Makayla Brown omeprazole. I am also having her start on triamcinolone cream. Additionally, I am having her maintain her DULoxetine, ALPRAZolam, cetirizine, and guaiFENesin.  Meds ordered this encounter  Medications  . triamcinolone cream (KENALOG) 0.1 %    Sig: Apply 1 application topically 2 (two) times daily.    Dispense:  80 g    Refill:  0    Medications Discontinued During This Encounter  Medication Reason  . DULoxetine (CYMBALTA) 60 MG capsule Duplicate  . omeprazole (PRILOSEC) 10 MG capsule Patient has not taken in last 30 days    Follow-up: No follow-ups on file.   Sherlene Shams, MD

## 2017-12-14 DIAGNOSIS — R42 Dizziness and giddiness: Secondary | ICD-10-CM | POA: Insufficient documentation

## 2017-12-14 NOTE — Assessment & Plan Note (Signed)
Etiology may be BPPV.  Neuro and HEENT are  exam normal.  . Vasculitis has been ruled out with labs due to concurrent reports of headache and vision changes.  Will send to ENT for table therapy and if persistent,  MRI brain. .    Lab Results  Component Value Date   ESRSEDRATE 16 12/12/2017   Lab Results  Component Value Date   CRP 0.7 12/12/2017   Lab Results  Component Value Date   WBC 5.8 12/12/2017   HGB 12.6 12/12/2017   HCT 37.3 12/12/2017   MCV 87.1 12/12/2017   PLT 255.0 12/12/2017

## 2017-12-18 LAB — HEMOGLOBIN A1C: HEMOGLOBIN A1C: 5.6 % (ref 4.6–6.5)

## 2018-01-15 ENCOUNTER — Encounter: Payer: Self-pay | Admitting: Internal Medicine

## 2018-01-15 DIAGNOSIS — G44211 Episodic tension-type headache, intractable: Secondary | ICD-10-CM

## 2018-01-15 DIAGNOSIS — R42 Dizziness and giddiness: Secondary | ICD-10-CM

## 2018-05-19 ENCOUNTER — Ambulatory Visit (INDEPENDENT_AMBULATORY_CARE_PROVIDER_SITE_OTHER): Payer: BC Managed Care – PPO | Admitting: Internal Medicine

## 2018-05-19 ENCOUNTER — Encounter: Payer: Self-pay | Admitting: Internal Medicine

## 2018-05-19 VITALS — BP 110/82 | HR 69 | Temp 98.1°F | Resp 14 | Ht 66.0 in | Wt 202.2 lb

## 2018-05-19 DIAGNOSIS — I679 Cerebrovascular disease, unspecified: Secondary | ICD-10-CM

## 2018-05-19 DIAGNOSIS — F3341 Major depressive disorder, recurrent, in partial remission: Secondary | ICD-10-CM | POA: Diagnosis not present

## 2018-05-19 DIAGNOSIS — E538 Deficiency of other specified B group vitamins: Secondary | ICD-10-CM | POA: Diagnosis not present

## 2018-05-19 DIAGNOSIS — Z1239 Encounter for other screening for malignant neoplasm of breast: Secondary | ICD-10-CM | POA: Diagnosis not present

## 2018-05-19 DIAGNOSIS — E6609 Other obesity due to excess calories: Secondary | ICD-10-CM

## 2018-05-19 DIAGNOSIS — Z6835 Body mass index (BMI) 35.0-35.9, adult: Secondary | ICD-10-CM

## 2018-05-19 DIAGNOSIS — R42 Dizziness and giddiness: Secondary | ICD-10-CM

## 2018-05-19 DIAGNOSIS — Z0000 Encounter for general adult medical examination without abnormal findings: Secondary | ICD-10-CM

## 2018-05-19 MED ORDER — DULOXETINE HCL 60 MG PO CPEP: 60.0000 mg | ORAL_CAPSULE | Freq: Every day | ORAL | 3 refills | Status: DC

## 2018-05-19 MED ORDER — ALPRAZOLAM 0.25 MG PO TABS: 0.2500 mg | ORAL_TABLET | Freq: Every evening | ORAL | 5 refills | Status: AC | PRN

## 2018-05-19 NOTE — Assessment & Plan Note (Signed)

## 2018-05-19 NOTE — Assessment & Plan Note (Signed)
History of sertraline intolerance.  Stable symptoms, Continue cymbalta  With prn alprazolam for severe anxiety. The risks and benefits of benzodiazepine use were reviewed with patient today including excessive sedation leading to respiratory depression,  impaired thinking/driving, and addiction.  Patient was advised to avoid concurrent use with alcohol, to use medication only as needed and not to share with others  .    

## 2018-05-19 NOTE — Assessment & Plan Note (Signed)
I have addressed  BMI and recommended wt loss of 10% of body weigh over the next 6 months using a low glycemic index diet and regular exercise a minimum of 5 days per week.   

## 2018-05-19 NOTE — Patient Instructions (Signed)
I RECOMMEND STARTING A BABY ASPIRIN DAILY  Fasting lipids,  CMET  INtrinsic factor antibody and RBC Folate to be done at [facility] annual mammogram has been ordered.  You are encouraged  to call to make your appointment at [facility] Female Adopting a healthy lifestyle and getting preventive care can go a long way to promote health and wellness. Talk with your health care provider about what schedule of regular examinations is right for you. This is a good chance for you to check in with your provider about disease prevention and staying healthy. In between checkups, there are plenty of things you can do on your own. Experts have done a lot of research about which lifestyle changes and preventive measures are most likely to keep you healthy. Ask your health care provider for more information. Weight and diet Eat a healthy diet  Be sure to include plenty of vegetables, fruits, low-fat dairy products, and lean protein.  Do not eat a lot of foods high in solid fats, added sugars, or salt.  Get regular exercise. This is one of the most important things you can do for your health. ? Most adults should exercise for at least 150 minutes each week. The exercise should increase your heart rate and make you sweat (moderate-intensity exercise). ? Most adults should also do strengthening exercises at least twice a week. This is in addition to the moderate-intensity exercise.  Maintain a healthy weight  Body mass index (BMI) is a measurement that can be used to identify possible weight problems. It estimates body fat based on height and weight. Your health care provider can help determine your BMI and help you achieve or maintain a healthy weight.  For females 44 years of age and older: ? A BMI below 18.5 is considered underweight. ? A BMI of 18.5 to 24.9 is normal. ? A BMI of 25 to 29.9 is considered overweight. ? A BMI of 30 and above is considered obese.  Watch levels of cholesterol  and blood lipids  You should start having your blood tested for lipids and cholesterol at 49 years of age, then have this test every 5 years.  You may need to have your cholesterol levels checked more often if: ? Your lipid or cholesterol levels are high. ? You are older than 49 years of age. ? You are at high risk for heart disease.  Cancer screening Lung Cancer  Lung cancer screening is recommended for adults 71-31 years old who are at high risk for lung cancer because of a history of smoking.  A yearly low-dose CT scan of the lungs is recommended for people who: ? Currently smoke. ? Have quit within the past 15 years. ? Have at least a 30-pack-year history of smoking. A pack year is smoking an average of one pack of cigarettes a day for 1 year.  Yearly screening should continue until it has been 15 years since you quit.  Yearly screening should stop if you develop a health problem that would prevent you from having lung cancer treatment.  Breast Cancer  Practice breast self-awareness. This means understanding how your breasts normally appear and feel.  It also means doing regular breast self-exams. Let your health care provider know about any changes, no matter how small.  If you are in your 20s or 30s, you should have a clinical breast exam (CBE) by a health care provider every 1-3 years as part of a regular health exam.  If  you are 40 or older, have a CBE every year. Also consider having a breast X-ray (mammogram) every year.  If you have a family history of breast cancer, talk to your health care provider about genetic screening.  If you are at high risk for breast cancer, talk to your health care provider about having an MRI and a mammogram every year.  Breast cancer gene (BRCA) assessment is recommended for women who have family members with BRCA-related cancers. BRCA-related cancers include: ? Breast. ? Ovarian. ? Tubal. ? Peritoneal cancers.  Results of the  assessment will determine the need for genetic counseling and BRCA1 and BRCA2 testing.  Cervical Cancer Your health care provider may recommend that you be screened regularly for cancer of the pelvic organs (ovaries, uterus, and vagina). This screening involves a pelvic examination, including checking for microscopic changes to the surface of your cervix (Pap test). You may be encouraged to have this screening done every 3 years, beginning at age 71.  For women ages 39-65, health care providers may recommend pelvic exams and Pap testing every 3 years, or they may recommend the Pap and pelvic exam, combined with testing for human papilloma virus (HPV), every 5 years. Some types of HPV increase your risk of cervical cancer. Testing for HPV may also be done on women of any age with unclear Pap test results.  Other health care providers may not recommend any screening for nonpregnant women who are considered low risk for pelvic cancer and who do not have symptoms. Ask your health care provider if a screening pelvic exam is right for you.  If you have had past treatment for cervical cancer or a condition that could lead to cancer, you need Pap tests and screening for cancer for at least 20 years after your treatment. If Pap tests have been discontinued, your risk factors (such as having a new sexual partner) need to be reassessed to determine if screening should resume. Some women have medical problems that increase the chance of getting cervical cancer. In these cases, your health care provider may recommend more frequent screening and Pap tests.  Colorectal Cancer  This type of cancer can be detected and often prevented.  Routine colorectal cancer screening usually begins at 49 years of age and continues through 49 years of age.  Your health care provider may recommend screening at an earlier age if you have risk factors for colon cancer.  Your health care provider may also recommend using home test  kits to check for hidden blood in the stool.  A small camera at the end of a tube can be used to examine your colon directly (sigmoidoscopy or colonoscopy). This is done to check for the earliest forms of colorectal cancer.  Routine screening usually begins at age 54.  Direct examination of the colon should be repeated every 5-10 years through 49 years of age. However, you may need to be screened more often if early forms of precancerous polyps or small growths are found.  Skin Cancer  Check your skin from head to toe regularly.  Tell your health care provider about any new moles or changes in moles, especially if there is a change in a mole's shape or color.  Also tell your health care provider if you have a mole that is larger than the size of a pencil eraser.  Always use sunscreen. Apply sunscreen liberally and repeatedly throughout the day.  Protect yourself by wearing long sleeves, pants, a wide-brimmed hat, and sunglasses  whenever you are outside.  Heart disease, diabetes, and high blood pressure  High blood pressure causes heart disease and increases the risk of stroke. High blood pressure is more likely to develop in: ? People who have blood pressure in the high end of the normal range (130-139/85-89 mm Hg). ? People who are overweight or obese. ? People who are African American.  If you are 18-80 years of age, have your blood pressure checked every 3-5 years. If you are 76 years of age or older, have your blood pressure checked every year. You should have your blood pressure measured twice-once when you are at a hospital or clinic, and once when you are not at a hospital or clinic. Record the average of the two measurements. To check your blood pressure when you are not at a hospital or clinic, you can use: ? An automated blood pressure machine at a pharmacy. ? A home blood pressure monitor.  If you are between 76 years and 35 years old, ask your health care provider if you  should take aspirin to prevent strokes.  Have regular diabetes screenings. This involves taking a blood sample to check your fasting blood sugar level. ? If you are at a normal weight and have a low risk for diabetes, have this test once every three years after 49 years of age. ? If you are overweight and have a high risk for diabetes, consider being tested at a younger age or more often. Preventing infection Hepatitis B  If you have a higher risk for hepatitis B, you should be screened for this virus. You are considered at high risk for hepatitis B if: ? You were born in a country where hepatitis B is common. Ask your health care provider which countries are considered high risk. ? Your parents were born in a high-risk country, and you have not been immunized against hepatitis B (hepatitis B vaccine). ? You have HIV or AIDS. ? You use needles to inject street drugs. ? You live with someone who has hepatitis B. ? You have had sex with someone who has hepatitis B. ? You get hemodialysis treatment. ? You take certain medicines for conditions, including cancer, organ transplantation, and autoimmune conditions.  Hepatitis C  Blood testing is recommended for: ? Everyone born from [birth year] through 1967. ? Anyone with known risk factors for hepatitis C.  Sexually transmitted infections (STIs)  You should be screened for sexually transmitted infections (STIs) including gonorrhea and chlamydia if: ? You are sexually active and are younger than 49 years of age. ? You are older than 49 years of age and your health care provider tells you that you are at risk for this type of infection. ? Your sexual activity has changed since you were last screened and you are at an increased risk for chlamydia or gonorrhea. Ask your health care provider if you are at risk.  If you do not have HIV, but are at risk, it may be recommended that you take a prescription medicine daily to prevent HIV infection. This is  called pre-exposure prophylaxis (PrEP). You are considered at risk if: ? You are sexually active and do not regularly use condoms or know the HIV status of your partner(s). ? You take drugs by injection. ? You are sexually active with a partner who has HIV.  Talk with your health care provider about whether you are at high risk of being infected with HIV. If you choose to begin PrEP, you should  first be tested for HIV. You should then be tested every 3 months for as long as you are taking PrEP. Pregnancy  If you are premenopausal and you may become pregnant, ask your health care provider about preconception counseling.  If you may become pregnant, take 400 to 800 micrograms (mcg) of folic acid every day.  If you want to prevent pregnancy, talk to your health care provider about birth control (contraception). Osteoporosis and menopause  Osteoporosis is a disease in which the bones lose minerals and strength with aging. This can result in serious bone fractures. Your risk for osteoporosis can be identified using a bone density scan.  If you are 90 years of age or older, or if you are at risk for osteoporosis and fractures, ask your health care provider if you should be screened.  Ask your health care provider whether you should take a calcium or vitamin D supplement to lower your risk for osteoporosis.  Menopause may have certain physical symptoms and risks.  Hormone replacement therapy may reduce some of these symptoms and risks. Talk to your health care provider about whether hormone replacement therapy is right for you. Follow these instructions at home:  Schedule regular health, dental, and eye exams.  Stay current with your immunizations.  Do not use any tobacco products including cigarettes, chewing tobacco, or electronic cigarettes.  If you are pregnant, do not drink alcohol.  If you are breastfeeding, limit how much and how often you drink alcohol.  Limit alcohol intake to  no more than 1 drink per day for nonpregnant women. One drink equals 12 ounces of beer, 5 ounces of wine, or 1 ounces of hard liquor.  Do not use street drugs.  Do not share needles.  Ask your health care provider for help if you need support or information about quitting drugs.  Tell your health care provider if you often feel depressed.  Tell your health care provider if you have ever been abused or do not feel safe at home. This information is not intended to replace advice given to you by your health care provider. Make sure you discuss any questions you have with your health care provider. Document Released: 02/12/2011 Document Revised: 01/05/2016 Document Reviewed: 05/03/2015 Elsevier Interactive Patient Education  [employer].

## 2018-05-19 NOTE — Assessment & Plan Note (Signed)
Attributed to b12 deficiency by neurology,.  checing rbc folate and intrinsic factor ab

## 2018-05-19 NOTE — Progress Notes (Signed)
Patient ID: Makayla Brown, female    DOB: Dec 26, 1970  Age: 47 y.o. MRN: [id number]  The patient is here for annual  wellness examination and management of other chronic and acute problems.   The risk factors are reflected in the social history.  The roster of all physicians providing medical care to patient - is listed in the Snapshot section of the chart.  Activities of daily living:  The patient is 100% independent in all ADLs: dressing, toileting, feeding as well as independent mobility  Home safety : The patient has smoke detectors in the home. They wear seatbelts.  There are no firearms at home. There is no violence in the home.   There is no risks for hepatitis, STDs or HIV. There is no   history of blood transfusion. They have no travel history to infectious disease endemic areas of the world.  The patient has seen their dentist in the last six month. They have seen their eye doctor in the last year. They deny  hearing difficulty with regard to whispered voices and some television programs.  They have deferred audiologic testing in the last year.  They do not  have excessive sun exposure. Discussed the need for sun protection: hats, long sleeves and use of sunscreen if there is significant sun exposure.   Diet: the importance of a healthy diet is discussed.  She has been eating more fast food lately an dnot cooking. .  The benefits of regular aerobic exercise were discussed. She is not exercising regularly   Depression screen: there are no signs or vegative symptoms of depression- irritability, change in appetite, anhedonia, sadness/tearfullness.  The following portions of the patient's history were reviewed and updated as appropriate: allergies, current medications, past family history, past medical history,  past surgical history, past social history  and problem list.  Visual acuity was not assessed per patient preference since she has regular follow up with her ophthalmologist.  Hearing and body mass index were assessed and reviewed.   During the course of the visit the patient was educated and counseled about appropriate screening and preventive services including : fall prevention , diabetes screening, nutrition counseling, colorectal cancer screening, and recommended immunizations.    CC: The primary encounter diagnosis was Breast cancer screening. Diagnoses of B12 deficiency, Cerebrovascular disease, Recurrent major depressive disorder, in partial remission (HCC), Dizziness, nonspecific, Encounter for preventive health examination, and Class 2 obesity due to excess calories without serious comorbidity with body mass index (BMI) of 35.0 to 35.9 in adult were also pertinent to this visit.  1) follow up on persistent headache and dizziness.  Referral to ENT and neurology reviewed.  She has had B12 deficiency diagnosed and MRI brain noted microvascular ischemic changes .  Sh ei staking b12 oral supplements.  RBC folate and Intrinsic factor Ab not checked.    History [name] has a past medical history of Depression and Headache(784.0).   She has a past surgical history that includes Cholecystectomy (2005) and Tubal ligation (2010).   Her family history includes Alzheimer's disease (age of onset: 24) in her mother; Arthritis in her maternal grandfather; COPD in her father; Cancer in her father and maternal grandfather; Coronary artery disease in her father.She reports that she has never smoked. She has never used smokeless tobacco. She reports that she does not drink alcohol or use drugs.  Outpatient Medications Prior to Visit  Medication Sig Dispense Refill  . cetirizine (ZYRTEC) 10 MG tablet Take 10 mg by mouth  daily.    . guaiFENesin (MUCINEX) 600 MG 12 hr tablet Take 600 mg by mouth 2 (two) times daily.    . Magnesium 400 MG CAPS Take 1 capsule by mouth daily.    . vitamin B-12 (CYANOCOBALAMIN) 1000 MCG tablet Take 1,000 mcg by mouth daily.    Marland Kitchen ALPRAZolam (XANAX)  0.25 MG tablet Take 0.25 mg by mouth at bedtime as needed for anxiety.    . DULoxetine (CYMBALTA) 60 MG capsule TAKE ONE CAPSULE BY MOUTH ONCE DAILY 30 capsule 3  . SUMAtriptan (IMITREX) 100 MG tablet TAKE 1 TABLET BY MOUTH ONCE AS NEEDED FOR MIGRAINE MAY TAKE A SECOND DOSE AFTER 2 HOURS IF NEEDED  6  . triamcinolone cream (KENALOG) 0.1 % Apply 1 application topically 2 (two) times daily. (Patient not taking: Reported on 05/19/2018) 80 g 0   No facility-administered medications prior to visit.     Review of Systems   Patient denies headache, fevers, malaise, unintentional weight loss, skin rash, eye pain, sinus congestion and sinus pain, sore throat, dysphagia,  hemoptysis , cough, dyspnea, wheezing, chest pain, palpitations, orthopnea, edema, abdominal pain, nausea, melena, diarrhea, constipation, flank pain, dysuria, hematuria, urinary  Frequency, nocturia, numbness, tingling, seizures,  Focal weakness, Loss of consciousness,  Tremor, insomnia, depression, anxiety, and suicidal ideation.      Objective:  BP 110/82 (BP Location: Left Arm, Patient Position: Sitting, Cuff Size: Large)   Pulse 69   Temp 98.1 F (36.7 C) (Oral)   Resp 14   Ht 5\' 6"  (1.676 m)   Wt 202 lb 3.2 oz (91.7 kg)   SpO2 98%   BMI 32.64 kg/m   Physical Exam   General appearance: alert, cooperative and appears stated age Head: Normocephalic, without obvious abnormality, atraumatic Eyes: conjunctivae/corneas clear. PERRL, EOM's intact. Fundi benign. Ears: normal TM's and external ear canals both ears Nose: Nares normal. Septum midline. Mucosa normal. No drainage or sinus tenderness. Throat: lips, mucosa, and tongue normal; teeth and gums normal Neck: no adenopathy, no carotid bruit, no JVD, supple, symmetrical, trachea midline and thyroid not enlarged, symmetric, no tenderness/mass/nodules Lungs: clear to auscultation bilaterally Breasts: normal appearance, no masses or tenderness Heart: regular rate and rhythm,  S1, S2 normal, no murmur, click, rub or gallop Abdomen: soft, non-tender; bowel sounds normal; no masses,  no organomegaly Extremities: extremities normal, atraumatic, no cyanosis or edema Pulses: 2+ and symmetric Skin: Skin color, texture, turgor normal. No rashes or lesions Neurologic: Alert and oriented X 3, normal strength and tone. Normal symmetric reflexes. Normal coordination and gait.      Assessment & Plan:   Problem List Items Addressed This Visit    Depression    History of sertraline intolerance.  Stable symptoms, Continue cymbalta  With prn alprazolam for severe anxiety. The risks and benefits of benzodiazepine use were reviewed with patient today including excessive sedation leading to respiratory depression,  impaired thinking/driving, and addiction.  Patient was advised to avoid concurrent use with alcohol, to use medication only as needed and not to share with others  .         Relevant Medications   ALPRAZolam (XANAX) 0.25 MG tablet   DULoxetine (CYMBALTA) 60 MG capsule   Dizziness, nonspecific    Attributed to b12 deficiency by neurology,.  checing rbc folate and intrinsic factor ab        Encounter for preventive health examination    age appropriate education and counseling updated, referrals for preventative services and immunizations addressed, dietary and  smoking counseling addressed, most recent labs reviewed.  I have personally reviewed and have noted:  1) the patient's medical and social history 2) The pt's use of alcohol, tobacco, and illicit drugs 3) The patient's current medications and supplements 4) Functional ability including ADL's, fall risk, home safety risk, hearing and visual impairment 5) Diet and physical activities 6) Evidence for depression or mood disorder 7) The patient's height, weight, and BMI have been recorded in the chart  I have made referrals, and provided counseling and education based on review of the above      Obesity     I have addressed  BMI and recommended wt loss of 10% of body weigh over the next 6 months using a low glycemic index diet and regular exercise a minimum of 5 days per week.         Other Visit Diagnoses    Breast cancer screening    -  Primary   Relevant Orders   MM 3D SCREEN BREAST BILATERAL   B12 deficiency       Relevant Orders   Comprehensive metabolic panel   Intrinsic Factor Antibodies   RBC Folate   Cerebrovascular disease       Relevant Orders   Lipid panel      I have discontinued Addelyn W. Rivers's triamcinolone cream. I have also changed her ALPRAZolam and DULoxetine. Additionally, I am having her maintain her cetirizine, guaiFENesin, SUMAtriptan, vitamin B-12, and Magnesium.  Meds ordered this encounter  Medications  . ALPRAZolam (XANAX) 0.25 MG tablet    Sig: Take 1 tablet (0.25 mg total) by mouth at bedtime as needed for anxiety.    Dispense:  30 tablet    Refill:  5  . DULoxetine (CYMBALTA) 60 MG capsule    Sig: Take 1 capsule (60 mg total) by mouth daily.    Dispense:  30 capsule    Refill:  3    Medications Discontinued During This Encounter  Medication Reason  . triamcinolone cream (KENALOG) 0.1 % Completed Course  . ALPRAZolam (XANAX) 0.25 MG tablet Reorder  . DULoxetine (CYMBALTA) 60 MG capsule Reorder    Follow-up: Return in about 1 year (around 05/20/2019).   Sherlene Shams, MD

## 2018-05-22 LAB — HEPATIC FUNCTION PANEL
ALT: 22 (ref 7–35)
AST: 23 (ref 13–35)
Alkaline Phosphatase: 77 (ref 25–125)
Bilirubin, Total: 0.8

## 2018-05-22 LAB — LIPID PANEL
CHOLESTEROL: 190 (ref 0–200)
HDL: 59 (ref 35–70)
LDL CALC: 111
TRIGLYCERIDES: 101 (ref 40–160)

## 2018-05-22 LAB — BASIC METABOLIC PANEL
BUN: 18 (ref 4–21)
CREATININE: 0.9 (ref 0.5–1.1)
Glucose: 90
Potassium: 4.5 (ref 3.4–5.3)
Sodium: 137 (ref 137–147)

## 2018-06-02 ENCOUNTER — Telehealth: Payer: Self-pay | Admitting: Internal Medicine

## 2018-07-17 ENCOUNTER — Other Ambulatory Visit: Payer: Self-pay

## 2018-07-23 LAB — HM MAMMOGRAPHY

## 2018-07-31 ENCOUNTER — Encounter: Payer: Self-pay | Admitting: Internal Medicine

## 2018-08-03 ENCOUNTER — Other Ambulatory Visit: Payer: Self-pay | Admitting: Internal Medicine

## 2018-08-03 MED ORDER — TRIAMCINOLONE ACETONIDE 0.1 % EX CREA: 1.0000 "application " | TOPICAL_CREAM | Freq: Two times a day (BID) | CUTANEOUS | 2 refills | Status: DC

## 2018-09-18 MED ORDER — DULOXETINE HCL 60 MG PO CPEP: 60.0000 mg | ORAL_CAPSULE | Freq: Every day | ORAL | 1 refills | Status: DC

## 2019-05-22 ENCOUNTER — Encounter: Payer: BC Managed Care – PPO | Admitting: Internal Medicine

## 2019-06-02 ENCOUNTER — Other Ambulatory Visit: Payer: Self-pay

## 2019-06-04 ENCOUNTER — Encounter: Payer: Self-pay | Admitting: Internal Medicine

## 2019-06-04 ENCOUNTER — Other Ambulatory Visit: Payer: Self-pay

## 2019-06-04 ENCOUNTER — Other Ambulatory Visit (HOSPITAL_COMMUNITY)
Admission: RE | Admit: 2019-06-04 | Discharge: 2019-06-04 | Disposition: A | Discharge status: Home / Self Care | Payer: BC Managed Care – PPO | Source: Ambulatory Visit | Attending: Internal Medicine | Admitting: Internal Medicine

## 2019-06-04 ENCOUNTER — Ambulatory Visit (INDEPENDENT_AMBULATORY_CARE_PROVIDER_SITE_OTHER): Payer: BC Managed Care – PPO | Admitting: Internal Medicine

## 2019-06-04 VITALS — BP 112/76 | HR 62 | Temp 96.9°F | Resp 14 | Ht 66.0 in | Wt 217.2 lb

## 2019-06-04 DIAGNOSIS — E538 Deficiency of other specified B group vitamins: Secondary | ICD-10-CM

## 2019-06-04 DIAGNOSIS — Z8669 Personal history of other diseases of the nervous system and sense organs: Secondary | ICD-10-CM

## 2019-06-04 DIAGNOSIS — Z1231 Encounter for screening mammogram for malignant neoplasm of breast: Secondary | ICD-10-CM | POA: Diagnosis not present

## 2019-06-04 DIAGNOSIS — Z124 Encounter for screening for malignant neoplasm of cervix: Secondary | ICD-10-CM

## 2019-06-04 DIAGNOSIS — E6609 Other obesity due to excess calories: Secondary | ICD-10-CM | POA: Diagnosis not present

## 2019-06-04 DIAGNOSIS — Z6835 Body mass index (BMI) 35.0-35.9, adult: Secondary | ICD-10-CM

## 2019-06-04 DIAGNOSIS — Z0000 Encounter for general adult medical examination without abnormal findings: Secondary | ICD-10-CM

## 2019-06-04 DIAGNOSIS — F3341 Major depressive disorder, recurrent, in partial remission: Secondary | ICD-10-CM

## 2019-06-04 LAB — LIPID PANEL
Cholesterol: 186 mg/dL (ref 0–200)
HDL: 49.3 mg/dL (ref >39.00)
LDL Cholesterol: 112 mg/dL — ABNORMAL HIGH (ref 0–99)
NonHDL: 136.26
Total CHOL/HDL Ratio: 4
Triglycerides: 123 mg/dL (ref 0.0–149.0)
VLDL: 24.6 mg/dL (ref 0.0–40.0)

## 2019-06-04 LAB — COMPREHENSIVE METABOLIC PANEL
ALT: 17 U/L (ref 0–35)
AST: 16 U/L (ref 0–37)
Albumin: 4.3 g/dL (ref 3.5–5.2)
Alkaline Phosphatase: 83 U/L (ref 39–117)
BUN: 19 mg/dL (ref 6–23)
CO2: 29 mEq/L (ref 19–32)
Calcium: 9.1 mg/dL (ref 8.4–10.5)
Chloride: 104 mEq/L (ref 96–112)
Creatinine, Ser: 0.91 mg/dL (ref 0.40–1.20)
GFR: 65.45 mL/min (ref >60.00)
Glucose, Bld: 90 mg/dL (ref 70–99)
Potassium: 4.4 mEq/L (ref 3.5–5.1)
Sodium: 140 mEq/L (ref 135–145)
Total Bilirubin: 0.6 mg/dL (ref 0.2–1.2)
Total Protein: 6.4 g/dL (ref 6.0–8.3)

## 2019-06-04 LAB — VITAMIN B12: Vitamin B-12: >1500 pg/mL — ABNORMAL HIGH (ref 211–911)

## 2019-06-04 LAB — HEMOGLOBIN A1C: Hgb A1c MFr Bld: 5.7 % (ref 4.6–6.5)

## 2019-06-04 NOTE — Progress Notes (Signed)
Patient ID: Makayla Brown, female    DOB: Sep 22, 1970  Age: 48 y.o. MRN: [id number]  The patient is here for annual preventive examination and management of other chronic and acute problems.   The risk factors are reflected in the social history.  The roster of all physicians providing medical care to patient - is listed in the Snapshot section of the chart.  Activities of daily living:  The patient is 100% independent in all ADLs: dressing, toileting, feeding as well as independent mobility  Home safety : The patient has smoke detectors in the home. They wear seatbelts.  There are no firearms at home. There is no violence in the home.   There is no risks for hepatitis, STDs or HIV. There is no   history of blood transfusion. They have no travel history to infectious disease endemic areas of the world.  The patient has seen their dentist in the last six month. They have seen their eye doctor in the last year.she does not  have excessive sun exposure. Discussed the need for sun protection: hats, long sleeves and use of sunscreen if there is significant sun exposure.   Diet: the importance of a healthy diet is discussed. They do have a healthy diet.  The benefits of regular aerobic exercise were discussed. She walks 4 times per week ,  20 minutes.   Depression screen: there are no signs or vegative symptoms of depression- irritability, change in appetite, anhedonia, sadness/tearfullness.  The following portions of the patient's history were reviewed and updated as appropriate: allergies, current medications, past family history, past medical history,  past surgical history, past social history  and problem list.  Visual acuity was not assessed per patient preference since she has regular follow up with her ophthalmologist. Hearing and body mass index were assessed and reviewed.   During the course of the visit the patient was educated and counseled about appropriate screening and preventive  services including  Mcg : fall prevention , diabetes screening, nutrition counseling, colorectal cancer screening, and recommended immunizations.    CC: The primary encounter diagnosis was Breast cancer screening by mammogram. Diagnoses of Cervical cancer screening, B12 deficiency, Class 2 obesity due to excess calories without serious comorbidity with body mass index (BMI) of 35.0 to 35.9 in adult, Encounter for preventive health examination, Recurrent major depressive disorder, in partial remission (HCC), and History of migraine headaches were also pertinent to this visit.  B12 deficiency:  Taking one B12 tablet 2500daily  H/o migraines.  Averaging 2 migraines per month. Uses   imitrex 1/2 tablet works Depression/GAD:  Using  cymbalta  Once daily. Using xanax:  Once a  Week at the most.   Mother undergoing endometrial  biopsy x 2    History Brekyn has a past medical history of Depression and Headache(784.0).   She has a past surgical history that includes Cholecystectomy (2005) and Tubal ligation (2010).   Her family history includes Alzheimer's disease (age of onset: 61) in her mother; Arthritis in her maternal grandfather; COPD in her father; Cancer in her father and maternal grandfather; Coronary artery disease in her father.She reports that she has never smoked. She has never used smokeless tobacco. She reports that she does not drink alcohol or use drugs.  Outpatient Medications Prior to Visit  Medication Sig Dispense Refill  . ALPRAZolam (XANAX) 0.25 MG tablet Take 1 tablet (0.25 mg total) by mouth at bedtime as needed for anxiety. 30 tablet 5  . cetirizine (ZYRTEC) 10  MG tablet Take 10 mg by mouth daily.    . DULoxetine (CYMBALTA) 60 MG capsule Take 1 capsule (60 mg total) by mouth daily. 90 capsule 1  . SUMAtriptan (IMITREX) 100 MG tablet TAKE 1 TABLET BY MOUTH ONCE AS NEEDED FOR MIGRAINE MAY TAKE A SECOND DOSE AFTER 2 HOURS IF NEEDED  6  . triamcinolone cream (KENALOG) 0.1 % Apply  1 application topically 2 (two) times daily. 45 g 2  . vitamin B-12 (CYANOCOBALAMIN) 1000 MCG tablet Take 1,000 mcg by mouth daily.    Marland Kitchen. guaiFENesin (MUCINEX) 600 MG 12 hr tablet Take 600 mg by mouth 2 (two) times daily.    . Magnesium 400 MG CAPS Take 1 capsule by mouth daily.     No facility-administered medications prior to visit.     Review of Systems   Patient denies headache, fevers, malaise, unintentional weight loss, skin rash, eye pain, sinus congestion and sinus pain, sore throat, dysphagia,  hemoptysis , cough, dyspnea, wheezing, chest pain, palpitations, orthopnea, edema, abdominal pain, nausea, melena, diarrhea, constipation, flank pain, dysuria, hematuria, urinary  Frequency, nocturia, numbness, tingling, seizures,  Focal weakness, Loss of consciousness,  Tremor, insomnia, depression, anxiety, and suicidal ideation.      Objective:  BP 112/76 (BP Location: Left Arm, Patient Position: Sitting, Cuff Size: Large)   Pulse 62   Temp (!) 96.9 F (36.1 C) (Temporal)   Resp 14   Ht 5\' 6"  (1.676 m)   Wt 217 lb 3.2 oz (98.5 kg)   SpO2 96%   BMI 35.06 kg/m   Physical Exam  General Appearance:    Alert, cooperative, no distress, appears stated age  Head:    Normocephalic, without obvious abnormality, atraumatic  Eyes:    PERRL, conjunctiva/corneas clear, EOM's intact, fundi    benign, both eyes  Ears:    Normal TM's and external ear canals, both ears  Nose:   Nares normal, septum midline, mucosa normal, no drainage    or sinus tenderness  Throat:   Lips, mucosa, and tongue normal; teeth and gums normal  Neck:   Supple, symmetrical, trachea midline, no adenopathy;    thyroid:  no enlargement/tenderness/nodules; no carotid   bruit or JVD  Back:     Symmetric, no curvature, ROM normal, no CVA tenderness  Lungs:     Clear to auscultation bilaterally, respirations unlabored  Chest Wall:    No tenderness or deformity   Heart:    Regular rate and rhythm, S1 and S2 normal, no  murmur, rub   or gallop  Breast Exam:    No tenderness, masses, or nipple abnormality  Abdomen:     Soft, non-tender, bowel sounds active all four quadrants,    no masses, no organomegaly  Genitalia:    Pelvic: cervix normal in appearance, external genitalia normal, no adnexal masses or tenderness, no cervical motion tenderness, rectovaginal septum normal, uterus normal size, shape, and consistency and vagina normal without discharge  Extremities:   Extremities normal, atraumatic, no cyanosis or edema  Pulses:   2+ and symmetric all extremities  Skin:   Skin color, texture, turgor normal, no rashes or lesions  Lymph nodes:   Cervical, supraclavicular, and axillary nodes normal  Neurologic:   CNII-XII intact, normal strength, sensation and reflexes    throughout     Assessment & Plan:   Problem List Items Addressed This Visit      Unprioritized   Depression    History of sertraline intolerance.  Stable symptoms, Continue  cymbalta  With prn alprazolam for severe anxiety. The risks and benefits of benzodiazepine use were reviewed with patient today including excessive sedation leading to respiratory depression,  impaired thinking/driving, and addiction.  Patient was advised to avoid concurrent use with alcohol, to use medication only as needed and not to share with others  .         Encounter for preventive health examination    age appropriate education and counseling updated, referrals for preventative services and immunizations addressed, dietary and smoking counseling addressed, most recent labs reviewed.  I have personally reviewed and have noted:  1) the patient's medical and social history 2) The pt's use of alcohol, tobacco, and illicit drugs 3) The patient's current medications and supplements 4) Functional ability including ADL's, fall risk, home safety risk, hearing and visual impairment 5) Diet and physical activities 6) Evidence for depression or mood disorder 7) The  patient's height, weight, and BMI have been recorded in the chart  I have made referrals, and provided counseling and education based on review of the above      History of migraine headaches   Obesity   Relevant Orders   Comprehensive metabolic panel (Completed)   Lipid panel (Completed)   Hemoglobin A1c (Completed)    Other Visit Diagnoses    Breast cancer screening by mammogram    -  Primary   Relevant Orders   MM DIGITAL SCREENING BILATERAL   Cervical cancer screening       Relevant Orders   Cytology - PAP( [place])   B12 deficiency       Relevant Orders   RBC Folate (Completed)   Vitamin B12 (Completed)      I have discontinued Analicia W. Casimir's guaiFENesin and Magnesium. I am also having her maintain her cetirizine, SUMAtriptan, vitamin B-12, ALPRAZolam, triamcinolone cream, and DULoxetine.  No orders of the defined types were placed in this encounter.   Medications Discontinued During This Encounter  Medication Reason  . Magnesium 400 MG CAPS Patient has not taken in last 30 days  . guaiFENesin (MUCINEX) 600 MG 12 hr tablet Patient has not taken in last 30 days    Follow-up: Return in about 6 months (around 12/03/2019).   Crecencio Mc, MD

## 2019-06-04 NOTE — Patient Instructions (Signed)
Your intrinsic factor antibody was negative by [facility] last October  I am rechecking your b12 level today to make sure you are getting enough    Health Maintenance for Postmenopausal Women Menopause is a normal process in which your ability to get pregnant comes to an end. This process happens slowly over many months or years, usually between the ages of 50 and 47. Menopause is complete when you have missed your menstrual periods for 12 months. It is important to talk with your health care provider about some of the most common conditions that affect women after menopause (postmenopausal women). These include heart disease, cancer, and bone loss (osteoporosis). Adopting a healthy lifestyle and getting preventive care can help to promote your health and wellness. The actions you take can also lower your chances of developing some of these common conditions. What should I know about menopause? During menopause, you may get a number of symptoms, such as:  Hot flashes. These can be moderate or severe.  Night sweats.  Decrease in sex drive.  Mood swings.  Headaches.  Tiredness.  Irritability.  Memory problems.  Insomnia. Choosing to treat or not to treat these symptoms is a decision that you make with your health care provider. Do I need hormone replacement therapy?  Hormone replacement therapy is effective in treating symptoms that are caused by menopause, such as hot flashes and night sweats.  Hormone replacement carries certain risks, especially as you become older. If you are thinking about using estrogen or estrogen with progestin, discuss the benefits and risks with your health care provider. What is my risk for heart disease and stroke? The risk of heart disease, heart attack, and stroke increases as you age. One of the causes may be a change in the body's hormones during menopause. This can affect how your body uses dietary fats, triglycerides, and cholesterol. Heart attack and  stroke are medical emergencies. There are many things that you can do to help prevent heart disease and stroke. Watch your blood pressure  High blood pressure causes heart disease and increases the risk of stroke. This is more likely to develop in people who have high blood pressure readings, are of African descent, or are overweight.  Have your blood pressure checked: ? Every 3-5 years if you are 50-44 years of age. ? Every year if you are 50 years old or older. Eat a healthy diet   Eat a diet that includes plenty of vegetables, fruits, low-fat dairy products, and lean protein.  Do not eat a lot of foods that are high in solid fats, added sugars, or sodium. Get regular exercise Get regular exercise. This is one of the most important things you can do for your health. Most adults should:  Try to exercise for at least 150 minutes each week. The exercise should increase your heart rate and make you sweat (moderate-intensity exercise).  Try to do strengthening exercises at least twice each week. Do these in addition to the moderate-intensity exercise.  Spend less time sitting. Even light physical activity can be beneficial. Other tips  Work with your health care provider to achieve or maintain a healthy weight.  Do not use any products that contain nicotine or tobacco, such as cigarettes, e-cigarettes, and chewing tobacco. If you need help quitting, ask your health care provider.  Know your numbers. Ask your health care provider to check your cholesterol and your blood sugar (glucose). Continue to have your blood tested as directed by your health care provider.  Do I need screening for cancer? Depending on your health history and family history, you may need to have cancer screening at different stages of your life. This may include screening for:  Breast cancer.  Cervical cancer.  Lung cancer.  Colorectal cancer. What is my risk for osteoporosis? After menopause, you may be at  increased risk for osteoporosis. Osteoporosis is a condition in which bone destruction happens more quickly than new bone creation. To help prevent osteoporosis or the bone fractures that can happen because of osteoporosis, you may take the following actions:  If you are 50-72 years old, get at least 1,000 mg of calcium and at least 600 mg of vitamin D per day.  If you are older than age 50 but younger than age 28, get at least 1,200 mg of calcium and at least 600 mg of vitamin D per day.  If you are older than age 50, get at least 1,200 mg of calcium and at least 800 mg of vitamin D per day. Smoking and drinking excessive alcohol increase the risk of osteoporosis. Eat foods that are rich in calcium and vitamin D, and do weight-bearing exercises several times each week as directed by your health care provider. How does menopause affect my mental health? Depression may occur at any age, but it is more common as you become older. Common symptoms of depression include:  Low or sad mood.  Changes in sleep patterns.  Changes in appetite or eating patterns.  Feeling an overall lack of motivation or enjoyment of activities that you previously enjoyed.  Frequent crying spells. Talk with your health care provider if you think that you are experiencing depression. General instructions See your health care provider for regular wellness exams and vaccines. This may include:  Scheduling regular health, dental, and eye exams.  Getting and maintaining your vaccines. These include: ? Influenza vaccine. Get this vaccine each year before the flu season begins. ? Pneumonia vaccine. ? Shingles vaccine. ? Tetanus, diphtheria, and pertussis (Tdap) booster vaccine. Your health care provider may also recommend other immunizations. Tell your health care provider if you have ever been abused or do not feel safe at home. Summary  Menopause is a normal process in which your ability to get pregnant comes to an  end.  This condition causes hot flashes, night sweats, decreased interest in sex, mood swings, headaches, or lack of sleep.  Treatment for this condition may include hormone replacement therapy.  Take actions to keep yourself healthy, including exercising regularly, eating a healthy diet, watching your weight, and checking your blood pressure and blood sugar levels.  Get screened for cancer and depression. Make sure that you are up to date with all your vaccines. This information is not intended to replace advice given to you by your health care provider. Make sure you discuss any questions you have with your health care provider. Document Released: 09/21/2005 Document Revised: 07/23/2018 Document Reviewed: 07/23/2018 Elsevier Patient Education  2020 [employer].

## 2019-06-05 LAB — FOLATE RBC: RBC Folate: 735 ng/mL RBC (ref >280)

## 2019-06-06 DIAGNOSIS — Z8669 Personal history of other diseases of the nervous system and sense organs: Secondary | ICD-10-CM | POA: Insufficient documentation

## 2019-06-06 NOTE — Assessment & Plan Note (Signed)

## 2019-06-06 NOTE — Assessment & Plan Note (Signed)
History of sertraline intolerance.  Stable symptoms, Continue cymbalta  With prn alprazolam for severe anxiety. The risks and benefits of benzodiazepine use were reviewed with patient today including excessive sedation leading to respiratory depression,  impaired thinking/driving, and addiction.  Patient was advised to avoid concurrent use with alcohol, to use medication only as needed and not to share with others  .

## 2019-06-09 LAB — CYTOLOGY - PAP
Adequacy: ABSENT
Comment: NEGATIVE
Diagnosis: NEGATIVE
High risk HPV: NEGATIVE

## 2019-07-14 ENCOUNTER — Telehealth: Payer: Self-pay | Admitting: Internal Medicine

## 2019-07-14 DIAGNOSIS — R1032 Left lower quadrant pain: Secondary | ICD-10-CM

## 2019-07-14 NOTE — Telephone Encounter (Signed)
Patient said that left lower quadrant pain started on Wednesday.  Pt thought it was her ovary.  Patient said that it has gotten worse, abdominal pain, diarrhea, chills, a little nausea but no fever.  Pt said that she has IBS.  Pt said that this episode hasn't gotten better and she has had diarrhea for several days.  Patient said that she is drinking plenty of fluids.  Pt is able to eat.  Pt said that ast episode of diarrhea was at 12:00 today.  No appts available for PCP until Monday.  Will forward notes for review.  Please advise.

## 2019-07-14 NOTE — Telephone Encounter (Signed)
Copied from [place] [id number]. Topic: General - Other >> Jul 14, 2019 12:30 PM Keene Breath wrote: Reason for CRM: Patient had some abdominal pain, change in bowels and gas lasting for days.  Would like some advice from the nurse or doctor.  CB# [phone]

## 2019-07-14 NOTE — Telephone Encounter (Signed)
NO, TOMORROW IS BLOCKED FOR A REASON.   I HAVE A MEETING I HAVE TO LEAVE EARLY.   SHE MAY HAVE DIVERTICULITIS I WILL ORDER THE CT SCAN AS URGENT.

## 2019-07-15 ENCOUNTER — Ambulatory Visit
Admission: RE | Admit: 2019-07-15 | Discharge: 2019-07-15 | Disposition: A | Discharge status: Home / Self Care | Payer: BC Managed Care – PPO | Source: Ambulatory Visit | Attending: Internal Medicine | Admitting: Internal Medicine

## 2019-07-15 ENCOUNTER — Telehealth: Payer: Self-pay | Admitting: Internal Medicine

## 2019-07-15 ENCOUNTER — Other Ambulatory Visit: Payer: Self-pay

## 2019-07-15 DIAGNOSIS — K5732 Diverticulitis of large intestine without perforation or abscess without bleeding: Secondary | ICD-10-CM | POA: Insufficient documentation

## 2019-07-15 DIAGNOSIS — R1032 Left lower quadrant pain: Secondary | ICD-10-CM | POA: Insufficient documentation

## 2019-07-15 DIAGNOSIS — K5720 Diverticulitis of large intestine with perforation and abscess without bleeding: Secondary | ICD-10-CM

## 2019-07-15 MED ORDER — LEVOFLOXACIN 500 MG PO TABS: 500.0000 mg | ORAL_TABLET | Freq: Every day | ORAL | 0 refills | Status: DC

## 2019-07-15 MED ORDER — ONDANSETRON 4 MG PO TBDP: 4.0000 mg | ORAL_TABLET | Freq: Three times a day (TID) | ORAL | 0 refills | Status: AC | PRN

## 2019-07-15 MED ORDER — IOHEXOL 300 MG/ML  SOLN
100.0000 mL | Freq: Once | INTRAMUSCULAR | Status: AC | PRN
  Administered 2019-07-15: 12:00:00 100 mL via INTRAVENOUS

## 2019-07-15 MED ORDER — METRONIDAZOLE 500 MG PO TABS: 500.0000 mg | ORAL_TABLET | Freq: Three times a day (TID) | ORAL | 0 refills | Status: DC

## 2019-07-15 NOTE — Assessment & Plan Note (Signed)
Confirmed by CT scan obtained on Day 7 of symptoms including diarrhea , nausea and LLQ pain .  Patient is able to maintain hydration and was directed to go home and stop all foods.  Clear liquid diet until pain resolves.  levaquin and flagyl sent to [facility] in [place].

## 2019-07-15 NOTE — Telephone Encounter (Signed)
FYI

## 2019-07-15 NOTE — Telephone Encounter (Signed)
Spoke with pt and she has been scheduled for her one week follow up. Pt is aware of appt date and time.

## 2019-07-15 NOTE — Telephone Encounter (Signed)
Spoke w/ patient this morning.  Pt has a scheduled appt today for the CT SCAN @ [facility] @ 12noon.  Patient said that she is still having diarrhea, nausea and abdominal cramping.

## 2019-07-22 ENCOUNTER — Encounter: Payer: Self-pay | Admitting: Internal Medicine

## 2019-07-22 ENCOUNTER — Other Ambulatory Visit: Payer: Self-pay

## 2019-07-22 ENCOUNTER — Ambulatory Visit (INDEPENDENT_AMBULATORY_CARE_PROVIDER_SITE_OTHER): Payer: BC Managed Care – PPO | Admitting: Internal Medicine

## 2019-07-22 VITALS — Ht 66.0 in | Wt 217.0 lb

## 2019-07-22 DIAGNOSIS — K5720 Diverticulitis of large intestine with perforation and abscess without bleeding: Secondary | ICD-10-CM

## 2019-07-22 DIAGNOSIS — K5790 Diverticulosis of intestine, part unspecified, without perforation or abscess without bleeding: Secondary | ICD-10-CM | POA: Diagnosis not present

## 2019-07-22 DIAGNOSIS — Z1211 Encounter for screening for malignant neoplasm of colon: Secondary | ICD-10-CM

## 2019-07-22 NOTE — Assessment & Plan Note (Signed)
Right sided, confirmed with CT.  Symptoms of pain and diarrhea have resolved with 7 day course of flagyl/metronidazole and clear liquid/full liquid diet.  Advance to solids,  Avoid saladas and raw vegetables for 3 more days.  Discussed high fiber diet   Refer to GI for colonoscopy in January

## 2019-07-22 NOTE — Progress Notes (Signed)
Virtual Visit converted to Telephone .me  This visit type was conducted due to national recommendations for restrictions regarding the COVID-19 pandemic (e.g. social distancing).  This format is felt to be most appropriate for this patient at this time.  All issues noted in this document were discussed and addressed.  No physical exam was performed (except for noted visual exam findings with Video Visits).   I attempted to connect with@ on 07/22/19 at 10:30 AM EST by a video enabled telemedicine application.   Interactive audio and video telecommunications were initially established beteen this provider and patient, however ultimately failed, due to patient having technical difficulties. We continued and completed visit with audio only  and verified that I am speaking with the correct person using two identifiersand verified  that I am speaking with the correct person using two identifiers. Location patient: home Location provider: work or home office Persons participating in the virtual visit: patient, provider  I discussed the limitations, risks, security and privacy concerns of performing an evaluation and management service by telephone and the availability of in person appointments. I also discussed with the patient that there may be a patient responsible charge related to this service. The patient expressed understanding and agreed to proceed.  Reason for visit: diverticulitis follow up  HPI:  48 yr old female with CT confirmed right sided colonic diverticulitis presents for follow up.   Diagnosed on Dec 2 after presenting with LAP for the past 7 days. Appendicitis ruled out.  She was prescribed levaquin/flagyl for 7 days,  Advised to follow a Clear liquid diet and advance as tolerated.   She feels much better.  Her pain has resolved..  She had persistent diarrhea for the past week ,but reports that she had a formed stool today .  No fevers or blood in stools.    Reviewed prior colonoscopy  done n [id number] for right sided abd pina     ROS: See pertinent positives and negatives per HPI.  Past Medical History:  Diagnosis Date  . Depression   . [id number](335.4)     Past Surgical History:  Procedure Laterality Date  . CHOLECYSTECTOMY  2005   Dr. Pat Patrick, laparoscopic  . TUBAL LIGATION  2010   Barnett Applebaum    Family History  Problem Relation Age of Onset  . Coronary artery disease Father   . Cancer Father        lung  . COPD Father   . Arthritis Maternal Grandfather   . Cancer Maternal Grandfather        probable colon ca  . Alzheimer's disease Mother 34       lives at home with patient's brother     SOCIAL HX:  reports that she has never smoked. She has never used smokeless tobacco. She reports that she does not drink alcohol or use drugs.   Current Outpatient Medications:  .  ALPRAZolam (XANAX) 0.25 MG tablet, Take 1 tablet (0.25 mg total) by mouth at bedtime as needed for anxiety., Disp: 30 tablet, Rfl: 5 .  cetirizine (ZYRTEC) 10 MG tablet, Take 10 mg by mouth daily., Disp: , Rfl:  .  DULoxetine (CYMBALTA) 60 MG capsule, Take 1 capsule (60 mg total) by mouth daily., Disp: 90 capsule, Rfl: 1 .  metroNIDAZOLE (FLAGYL) 500 MG tablet, Take 1 tablet (500 mg total) by mouth 3 (three) times daily., Disp: 21 tablet, Rfl: 0 .  ondansetron (ZOFRAN ODT) 4 MG disintegrating tablet, Take 1 tablet (4 mg total) by mouth  every 8 (eight) hours as needed for nausea or vomiting., Disp: 20 tablet, Rfl: 0 .  SUMAtriptan (IMITREX) 100 MG tablet, TAKE 1 TABLET BY MOUTH ONCE AS NEEDED FOR MIGRAINE MAY TAKE A SECOND DOSE AFTER 2 HOURS IF NEEDED, Disp: , Rfl: 6 .  triamcinolone cream (KENALOG) 0.1 %, Apply 1 application topically 2 (two) times daily., Disp: 45 g, Rfl: 2 .  vitamin B-12 (CYANOCOBALAMIN) 1000 MCG tablet, Take 1,000 mcg by mouth daily., Disp: , Rfl:   EXAM:  General impression: alert, cooperative and articulate.  No signs of being in distress  Lungs: speech is fluent sentence  length suggests that patient is not short of breath and not punctuated by cough, sneezing or sniffing. Marland Kitchen   Psych: affect normal.  speech is articulate and non pressured .  Denies suicidal thoughts   ASSESSMENT AND PLAN:  Discussed the following assessment and plan:  Diverticulitis of large intestine with perforation without abscess or bleeding  Diverticulitis large intestine Right sided, confirmed with CT.  Symptoms of pain and diarrhea have resolved with 7 day course of flagyl/metronidazole and clear liquid/full liquid diet.  Advance to solids,  Avoid saladas and raw vegetables for 3 more days.  Discussed high fiber diet   Refer to GI for colonoscopy in January     I discussed the assessment and treatment plan with the patient. The patient was provided an opportunity to ask questions and all were answered. The patient agreed with the plan and demonstrated an understanding of the instructions.   The patient was advised to call back or seek an in-person evaluation if the symptoms worsen or if the condition fails to improve as anticipated.  I provided  25 minutes of non-face-to-face time during this encounter reviewing patient's current problems and past procedures/imaging studies, providing counseling on the above mentioned problems , and coordination  of care .  Sherlene Shams, MD

## 2019-08-27 ENCOUNTER — Other Ambulatory Visit: Payer: Self-pay | Admitting: Internal Medicine

## 2019-08-28 NOTE — Telephone Encounter (Signed)
Refilled: 08/03/2018 Last OV: 07/22/2019 Next OV: not scheduled

## 2019-09-14 ENCOUNTER — Telehealth: Payer: Self-pay | Admitting: Internal Medicine

## 2019-09-14 DIAGNOSIS — R1032 Left lower quadrant pain: Secondary | ICD-10-CM

## 2019-09-14 DIAGNOSIS — K5720 Diverticulitis of large intestine with perforation and abscess without bleeding: Secondary | ICD-10-CM

## 2019-09-14 MED ORDER — AMOXICILLIN-POT CLAVULANATE 875-125 MG PO TABS: 1.0000 | ORAL_TABLET | Freq: Two times a day (BID) | ORAL | 0 refills | Status: DC

## 2019-09-14 NOTE — Telephone Encounter (Signed)
Pt stated that she is having some pain on the right side but most of the pain is on the left side today.   Pt is aware of the CT scan that was ordered and the antibiotic that was sent in.

## 2019-09-14 NOTE — Telephone Encounter (Signed)
Pt wanted to let you know that she is having a diverticulitis attack now.

## 2019-09-14 NOTE — Telephone Encounter (Signed)
Patient said Dr. Darrick Huntsman told her to let her know when she has a diverticulitous attack. Pt states she is having one now and would like a call back.

## 2019-09-14 NOTE — Telephone Encounter (Signed)
What side is she having pain on ?  Her last episode in Dec was right sided  So  I am ordering the CT and the antibiotics  Using that history.  RX fpr augmenint has been sent to her [facility] pharmacy

## 2019-09-15 ENCOUNTER — Ambulatory Visit
Admission: RE | Admit: 2019-09-15 | Discharge: 2019-09-15 | Disposition: A | Discharge status: Home / Self Care | Payer: BC Managed Care – PPO | Source: Ambulatory Visit | Attending: Internal Medicine | Admitting: Internal Medicine

## 2019-09-15 ENCOUNTER — Other Ambulatory Visit: Payer: Self-pay

## 2019-09-15 DIAGNOSIS — R1032 Left lower quadrant pain: Secondary | ICD-10-CM | POA: Insufficient documentation

## 2019-09-15 DIAGNOSIS — K5720 Diverticulitis of large intestine with perforation and abscess without bleeding: Secondary | ICD-10-CM | POA: Insufficient documentation

## 2019-09-15 MED ORDER — IOHEXOL 300 MG/ML  SOLN
100.0000 mL | Freq: Once | INTRAMUSCULAR | Status: AC | PRN
  Administered 2019-09-15: 100 mL via INTRAVENOUS

## 2019-09-16 DIAGNOSIS — K5720 Diverticulitis of large intestine with perforation and abscess without bleeding: Secondary | ICD-10-CM

## 2019-09-16 DIAGNOSIS — K5732 Diverticulitis of large intestine without perforation or abscess without bleeding: Secondary | ICD-10-CM

## 2019-09-17 ENCOUNTER — Other Ambulatory Visit: Payer: Self-pay

## 2019-09-17 ENCOUNTER — Ambulatory Visit: Payer: BC Managed Care – PPO | Admitting: Internal Medicine

## 2019-09-17 ENCOUNTER — Encounter: Payer: Self-pay | Admitting: Internal Medicine

## 2019-09-17 DIAGNOSIS — K5720 Diverticulitis of large intestine with perforation and abscess without bleeding: Secondary | ICD-10-CM | POA: Diagnosis not present

## 2019-09-17 DIAGNOSIS — Z6835 Body mass index (BMI) 35.0-35.9, adult: Secondary | ICD-10-CM

## 2019-09-17 DIAGNOSIS — E6609 Other obesity due to excess calories: Secondary | ICD-10-CM | POA: Diagnosis not present

## 2019-09-17 NOTE — Progress Notes (Signed)
Subjective:  Patient ID: Makayla Brown, female    DOB: Nov 08, 1970  Age: 49 y.o. MRN: [id number]  CC: Diagnoses of Class 2 obesity due to excess calories without serious comorbidity with body mass index (BMI) of 35.0 to 35.9 in adult and Diverticulitis of large intestine with perforation without abscess or bleeding were pertinent to this visit.  HPI Makayla Brown presents for followup on acute diverticulitis confirmed by contrasted CT abd/pelvis .  She has been feeling better  Since she changed her diet and started the antibiotics.  She is On augmentin day 3 and has been moving her bowels without use of miralax.   She has  snot advanced diet yet beyond grits and mashed potatoes.   Intentional weight loss.  She has lost 10 lbs intentionally by participating in a study at [facility] that compares the effects of various diets and has been most successful on a low fat diet.   This visit occurred during the SARS-CoV-2 public health emergency.  Safety protocols were in place, including screening questions prior to the visit, additional usage of staff PPE, and extensive cleaning of exam room while observing appropriate contact time as indicated for disinfecting solutions.     Outpatient Medications Prior to Visit  Medication Sig Dispense Refill  . ALPRAZolam (XANAX) 0.25 MG tablet Take 1 tablet (0.25 mg total) by mouth at bedtime as needed for anxiety. 30 tablet 5  . amoxicillin-clavulanate (AUGMENTIN) 875-125 MG tablet Take 1 tablet by mouth 2 (two) times daily. 14 tablet 0  . cetirizine (ZYRTEC) 10 MG tablet Take 10 mg by mouth daily.    . DULoxetine (CYMBALTA) 60 MG capsule Take 1 capsule (60 mg total) by mouth daily. 90 capsule 1  . ondansetron (ZOFRAN ODT) 4 MG disintegrating tablet Take 1 tablet (4 mg total) by mouth every 8 (eight) hours as needed for nausea or vomiting. 20 tablet 0  . SUMAtriptan (IMITREX) 100 MG tablet TAKE 1 TABLET BY MOUTH ONCE AS NEEDED FOR MIGRAINE MAY TAKE A SECOND DOSE  AFTER 2 HOURS IF NEEDED  6  . triamcinolone cream (KENALOG) 0.1 % APPLY  CREAM EXTERNALLY TWICE DAILY 45 g 0  . vitamin B-12 (CYANOCOBALAMIN) 1000 MCG tablet Take 1,000 mcg by mouth daily.    . metroNIDAZOLE (FLAGYL) 500 MG tablet Take 1 tablet (500 mg total) by mouth 3 (three) times daily. (Patient not taking: Reported on 09/17/2019) 21 tablet 0   No facility-administered medications prior to visit.    Review of Systems;  Patient denies headache, fevers, malaise, unintentional weight loss, skin rash, eye pain, sinus congestion and sinus pain, sore throat, dysphagia,  hemoptysis , cough, dyspnea, wheezing, chest pain, palpitations, orthopnea, edema, abdominal pain, nausea, melena, diarrhea, constipation, flank pain, dysuria, hematuria, urinary  Frequency, nocturia, numbness, tingling, seizures,  Focal weakness, Loss of consciousness,  Tremor, insomnia, depression, anxiety, and suicidal ideation.      Objective:  BP 118/74 (BP Location: Left Arm, Patient Position: Sitting, Cuff Size: Large)   Pulse 87   Temp (!) 97 F (36.1 C) (Temporal)   Resp 15   Ht 5\' 6"  (1.676 m)   Wt 207 lb (93.9 kg)   LMP 02/13/2016 (Approximate)   SpO2 98%   BMI 33.41 kg/m   BP Readings from Last 3 Encounters:  09/17/19 118/74  06/04/19 112/76  05/19/18 110/82    Wt Readings from Last 3 Encounters:  09/17/19 207 lb (93.9 kg)  07/22/19 217 lb (98.4 kg)  06/04/19 217 lb  3.2 oz (98.5 kg)    General appearance: alert, cooperative and appears stated age Ears: normal TM's and external ear canals both ears Throat: lips, mucosa, and tongue normal; teeth and gums normal Neck: no adenopathy, no carotid bruit, supple, symmetrical, trachea midline and thyroid not enlarged, symmetric, no tenderness/mass/nodules Back: symmetric, no curvature. ROM normal. No CVA tenderness. Lungs: clear to auscultation bilaterally Heart: regular rate and rhythm, S1, S2 normal, no murmur, click, rub or gallop Abdomen: soft,  non-tender; no guarding or rebound.  bowel sounds normal; no masses,  no organomegaly Pulses: 2+ and symmetric Skin: Skin color, texture, turgor normal. No rashes or lesions Lymph nodes: Cervical, supraclavicular, and axillary nodes normal.  Lab Results  Component Value Date   HGBA1C 5.7 06/04/2019   HGBA1C 5.6 12/12/2017    Lab Results  Component Value Date   CREATININE 0.91 06/04/2019   CREATININE 0.9 05/22/2018   CREATININE 0.91 12/12/2017    Lab Results  Component Value Date   WBC 5.8 12/12/2017   HGB 12.6 12/12/2017   HCT 37.3 12/12/2017   PLT 255.0 12/12/2017   GLUCOSE 90 06/04/2019   CHOL 186 06/04/2019   TRIG 123.0 06/04/2019   HDL 49.30 06/04/2019   LDLCALC 112 (H) 06/04/2019   ALT 17 06/04/2019   AST 16 06/04/2019   NA 140 06/04/2019   K 4.4 06/04/2019   CL 104 06/04/2019   CREATININE 0.91 06/04/2019   BUN 19 06/04/2019   CO2 29 06/04/2019   TSH 2.52 05/17/2017   HGBA1C 5.7 06/04/2019    CT Abdomen Pelvis W Contrast  Result Date: 09/15/2019 CLINICAL DATA:  Diarrhea, left lower quadrant abdominal pain, history of diverticulitis EXAM: CT ABDOMEN AND PELVIS WITH CONTRAST TECHNIQUE: Multidetector CT imaging of the abdomen and pelvis was performed using the standard protocol following bolus administration of intravenous contrast. CONTRAST:  OMNIPAQUE IOHEXOL 300 MG/ML  SOLN COMPARISON:  07/15/2019 FINDINGS: Lower chest: No acute abnormality. Hepatobiliary: No focal liver abnormality is seen. Status post cholecystectomy. No biliary dilatation. Pancreas: Unremarkable. No pancreatic ductal dilatation or surrounding inflammatory changes. Spleen: Normal in size without focal abnormality. Adrenals/Urinary Tract: Adrenal glands are unremarkable. Kidneys are normal, without renal calculi, focal lesion, or hydronephrosis. Bladder is unremarkable. Stomach/Bowel: Stomach is nondistended. Small bowel decompressed. Appendix normal. The colon is nondilated. Scattered  diverticula. Inflammatory/edematous changes around the proximal sigmoid segment with regional wall thickening and small bubbles of extraluminal gas adjacent to prominent thick-walled diverticulum, suggesting diverticulitis. No abscess. Vascular/Lymphatic: No significant vascular findings are present. No enlarged abdominal or pelvic lymph nodes. Reproductive: Stable Essure devices. No adnexal mass. Other: No ascites. No free air. Musculoskeletal: No acute or significant osseous findings. IMPRESSION: Sigmoid diverticulitis with  no abscess. Electronically Signed   By: Corlis Leak M.D.   On: 09/15/2019 10:26    Assessment & Plan:   Problem List Items Addressed This Visit      Unprioritized   Obesity    I have congratulated her in reduction of   BMI and encouraged  Continued weight loss with goal of 10% of body weight over the next 6 months using a low fat diet and regular exercise a minimum of 5 days per week.        Diverticulitis large intestine    Current episode involving the sigmoid colon .   Previous episode involved the right side.  She believes both episodes were triggered by eating pistachios. Symptoms are improving  And abd exam I benign today. Referral to colorectal  surgeon at [facility]          I have discontinued Abrielle Finck. Desai's metroNIDAZOLE. I am also having her maintain her cetirizine, SUMAtriptan, vitamin B-12, ALPRAZolam, DULoxetine, ondansetron, triamcinolone cream, and amoxicillin-clavulanate.  No orders of the defined types were placed in this encounter.   Medications Discontinued During This Encounter  Medication Reason  . metroNIDAZOLE (FLAGYL) 500 MG tablet Completed Course    Follow-up: No follow-ups on file.   Crecencio Mc, MD

## 2019-09-19 NOTE — Assessment & Plan Note (Addendum)
Current episode involving the sigmoid colon confirmed with CT scan.    Previous episode  In December was also confirmed with CT but involved the right side.  She believes both episodes were triggered by eating pistachios. Symptoms are improving  And abd exam I benign today. Referral to colorectal surgeon at [facility]

## 2019-09-19 NOTE — Assessment & Plan Note (Addendum)
I have congratulated her in reduction of   BMI and encouraged  Continued weight loss with goal of 10% of body weight over the next 6 months using a low fat diet and regular exercise a minimum of 5 days per week.

## 2019-10-22 ENCOUNTER — Other Ambulatory Visit: Payer: Self-pay | Admitting: Internal Medicine

## 2019-10-30 LAB — HM COLONOSCOPY

## 2020-01-28 ENCOUNTER — Telehealth: Payer: Self-pay | Admitting: Internal Medicine

## 2020-01-28 DIAGNOSIS — K5720 Diverticulitis of large intestine with perforation and abscess without bleeding: Secondary | ICD-10-CM

## 2020-01-28 MED ORDER — LEVOFLOXACIN 500 MG PO TABS: 500.0000 mg | ORAL_TABLET | Freq: Every day | ORAL | 0 refills | Status: DC

## 2020-01-28 MED ORDER — METRONIDAZOLE 500 MG PO TABS: 500.0000 mg | ORAL_TABLET | Freq: Four times a day (QID) | ORAL | 0 refills | Status: DC

## 2020-01-28 NOTE — Assessment & Plan Note (Signed)
3rd episode this year.  levaquin and flagyl prescribed.  CT if no resolution

## 2020-01-28 NOTE — Telephone Encounter (Signed)
Pt states that she is having a diverticulitis flare up and would like a refill of amoxicillin-clavulanate (AUGMENTIN) 875-125 MG tablet sent in to the [facility] in [place] NOT [place]. Please advise

## 2020-01-28 NOTE — Telephone Encounter (Signed)
Patient stated she is having some abdominal cramping, felt feverish, and yesterday she started having very watery stools. 14 BM since yesterday @1600 . She has had multiple CT and had colonoscopy. She has even saw a , didn't need surgery. Was informed by GI that she has diverticulitis in her entire colon. She would be having a lot of flare ups. She stated this is her third attack since December. She would like PCP to send in medication due to using the restroom so frequently.

## 2020-01-28 NOTE — Telephone Encounter (Signed)
Will you please triage °

## 2020-01-29 MED ORDER — METRONIDAZOLE 500 MG PO TABS: 500.0000 mg | ORAL_TABLET | Freq: Four times a day (QID) | ORAL | 0 refills | Status: AC

## 2020-01-29 MED ORDER — LEVOFLOXACIN 500 MG PO TABS: 500.0000 mg | ORAL_TABLET | Freq: Every day | ORAL | 0 refills | Status: AC

## 2020-01-29 NOTE — Addendum Note (Signed)
Addended by: Sherlene Shams on: 01/29/2020 12:57 PM   Modules accepted: Orders

## 2020-05-02 ENCOUNTER — Other Ambulatory Visit: Payer: Self-pay | Admitting: Internal Medicine

## 2020-06-06 ENCOUNTER — Encounter: Payer: BC Managed Care – PPO | Admitting: Internal Medicine

## 2020-09-06 ENCOUNTER — Encounter: Payer: Self-pay | Admitting: *Deleted

## 2020-09-07 LAB — HM MAMMOGRAPHY

## 2020-09-14 ENCOUNTER — Encounter: Payer: Self-pay | Admitting: Internal Medicine

## 2022-02-02 ENCOUNTER — Telehealth: Payer: Self-pay

## 2022-07-24 NOTE — Telephone Encounter (Signed)
Error

## 2024-09-08 ENCOUNTER — Encounter: Payer: Self-pay | Admitting: *Deleted

## 2024-09-08 DIAGNOSIS — Z006 Encounter for examination for normal comparison and control in clinical research program: Secondary | ICD-10-CM

## 2024-09-08 NOTE — Research (Addendum)
 Spoke with Makayla Brown about pre-event research. She states ok for me to look in her chart. Sent her the pre-event consents to read over. Encouraged her to call or text with any questions.   After reviewing her chart she will not qualify for pre-event due to Ca score of 0. I called an informed her that we will keep her on the list for future studies. Voices understanding.
